# Patient Record
Sex: Female | Born: 1980 | Race: White | Hispanic: Yes | Marital: Single | Smoking: Never smoker
Health system: Southern US, Community
[De-identification: ages and names within clinical notes are randomized; demographics above are authoritative.]

## PROBLEM LIST (undated history)

## (undated) DIAGNOSIS — K529 Noninfective gastroenteritis and colitis, unspecified: Secondary | ICD-10-CM

## (undated) DIAGNOSIS — O24419 Gestational diabetes mellitus in pregnancy, unspecified control: Secondary | ICD-10-CM

## (undated) HISTORY — DX: Gestational diabetes mellitus in pregnancy, unspecified control: O24.419

## (undated) HISTORY — PX: NO PAST SURGERIES: SHX2092

---

## 2000-12-08 ENCOUNTER — Emergency Department (HOSPITAL_COMMUNITY): Admission: EM | Admit: 2000-12-08 | Discharge: 2000-12-08 | Payer: Self-pay | Admitting: Emergency Medicine

## 2008-04-20 ENCOUNTER — Inpatient Hospital Stay (HOSPITAL_COMMUNITY): Admission: EM | Admit: 2008-04-20 | Discharge: 2008-04-22 | Payer: Self-pay | Admitting: Emergency Medicine

## 2009-08-11 ENCOUNTER — Encounter: Payer: Self-pay | Admitting: Cardiology

## 2009-08-11 DIAGNOSIS — R0989 Other specified symptoms and signs involving the circulatory and respiratory systems: Secondary | ICD-10-CM | POA: Insufficient documentation

## 2009-08-12 ENCOUNTER — Ambulatory Visit: Payer: Self-pay | Admitting: Cardiology

## 2009-08-12 DIAGNOSIS — I491 Atrial premature depolarization: Secondary | ICD-10-CM

## 2009-08-18 ENCOUNTER — Ambulatory Visit (HOSPITAL_COMMUNITY): Admission: RE | Admit: 2009-08-18 | Discharge: 2009-08-18 | Payer: Self-pay | Admitting: Family Medicine

## 2010-01-08 ENCOUNTER — Inpatient Hospital Stay (HOSPITAL_COMMUNITY)
Admission: AD | Admit: 2010-01-08 | Discharge: 2010-01-10 | Payer: Self-pay | Source: Home / Self Care | Attending: Obstetrics and Gynecology | Admitting: Obstetrics and Gynecology

## 2010-01-08 LAB — CBC
HCT: 36.7 % (ref 36.0–46.0)
Hemoglobin: 13.2 g/dL (ref 12.0–15.0)
MCH: 32.6 pg (ref 26.0–34.0)
MCHC: 36 g/dL (ref 30.0–36.0)
MCV: 90.6 fL (ref 78.0–100.0)
Platelets: 199 10*3/uL (ref 150–400)
RBC: 4.05 MIL/uL (ref 3.87–5.11)
RDW: 13.5 % (ref 11.5–15.5)
WBC: 8.4 10*3/uL (ref 4.0–10.5)

## 2010-01-08 LAB — ABO/RH: ABO/RH(D): O POS

## 2010-01-08 LAB — MRSA PCR SCREENING: MRSA by PCR: NEGATIVE

## 2010-01-08 LAB — RPR: RPR Ser Ql: NONREACTIVE

## 2010-02-01 LAB — CONVERTED CEMR LAB
BUN: 8 mg/dL (ref 6–23)
CO2: 25 meq/L (ref 19–32)
Calcium: 8.8 mg/dL (ref 8.4–10.5)
Chloride: 100 meq/L (ref 96–112)
Creatinine, Ser: 0.4 mg/dL (ref 0.4–1.2)
GFR calc non Af Amer: 200.04 mL/min (ref >60)
Glucose, Bld: 64 mg/dL — ABNORMAL LOW (ref 70–99)
Magnesium: 1.9 mg/dL (ref 1.5–2.5)
Potassium: 4 meq/L (ref 3.5–5.1)
Sodium: 134 meq/L — ABNORMAL LOW (ref 135–145)
TSH: 1.56 microintl units/mL (ref 0.35–5.50)

## 2010-02-02 ENCOUNTER — Emergency Department (HOSPITAL_COMMUNITY)
Admission: EM | Admit: 2010-02-02 | Discharge: 2010-02-02 | Payer: Self-pay | Source: Home / Self Care | Admitting: Emergency Medicine

## 2010-02-03 NOTE — Assessment & Plan Note (Signed)
Summary: np6/[redacted] weeks pregnant/palps/irregular heart beat   Visit Type:  Initial Consult Primary Provider:  Health clinic  CC:  Irregular heart beat- Pt. is [redacted] weeks pregnant.  History of Present Illness: The patient comes in today referred by the women's health clinic by Dr. Shawnie Pons for the evaluation of asymptomatic irregular heartbeats.  She is currently 30 years of age and pregnant at [redacted] weeks. This is her second pregnancy. Her other child is 30 years old and she had no problems with delivery.  She denies any palpitations, presyncope or syncope, shortness of breath, orthopnea, PND, chest pain or edema. She denies a previous cardiac history. She has had no problems with this pregnancy. Her hemoglobin is stable in the clinic. Outside lab data were reviewed  Current Medications (verified): 1)  Prenatal Vitamins 0.8 Mg Tabs (Prenatal Multivit-Min-Fe-Fa) .... 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  Alcohol Use - no Drug Use - no  Risk Factors: Smoking Status: never (08/11/2009)  Review of Systems       negative other than history of present illness  Vital Signs:  Patient profile:   30 year old female Height:      59 inches Weight:      149 pounds BMI:     30.20 Pulse rate:   69 / minute Pulse rhythm:   irregular Resp:     18 per minute BP sitting:   98 / 70  (left arm) Cuff size:   large  Vitals Entered By: Danielle Rankin, CMA (August 12, 2009 12:49 PM)  Physical Exam  General:  pleasant, no acute distress, pregnant Head:  normocephalic and atraumatic Eyes:  PERRLA/EOM intact; conjunctiva and lids normal. Neck:  Neck supple, no JVD. No masses, thyromegaly or abnormal cervical nodes. Chest  Loree Shehata:  no deformities or breast masses noted Lungs:  Clear bilaterally to auscultation and percussion. Heart:  PMI nondisplaced, normal S1-S2, physiologically split, no murmur or gallop. Carotids are normal. No JVD Abdomen:  positive bowel sounds,gravida Msk:  Back normal, normal gait. Muscle strength and tone normal. Pulses:  pulses normal in all 4 extremities Extremities:  No clubbing or cyanosis. Neurologic:  Alert and oriented x 3. Skin:  Intact without lesions or rashes. Psych:  Normal affect.   EKG  Procedure date:  08/12/2009  Findings:      normal sinus rhythm with PACs P-R QRS and QTC intervals are normal.  Impression & Recommendations:  Problem # 1:  PREMATURE ATRIAL CONTRACTIONS (ICD-427.61) Assessment New These are most likely benign. She steadily symptomatically. We'll check magnesium, potassium, and TSH. I do not feel an echocardiogram is necessary with a normal EKG otherwise not to mention a normal exam. We'll see her back p.r.n. Orders: EKG w/ Interpretation (93000) TLB-TSH (Thyroid Stimulating Hormone) (84443-TSH) TLB-Magnesium (Mg) (83735-MG) TLB-BMP (Basic Metabolic Panel-BMET) (80048-METABOL)  Patient Instructions: 1)  Your physician recommends that you schedule a follow-up appointment in:  as needed with Dr. Daleen Squibb 2)  Your physician recommends that you continue on your current medications as directed. Please refer to the Current Medication list given to you today. 3)  Your physician recommends that you return for lab work today bmet, tsh, mg

## 2010-02-03 NOTE — Letter (Signed)
Summary: [facility] Dept of Public Health - Prenatal Health History   [facility] Dept of Public Health - Prenatal Health History   Imported By: Marylou Mccoy 08/25/2009 10:29:54  _____________________________________________________________________  External Attachment:    Type:   Image     Comment:   External Document

## 2010-02-11 ENCOUNTER — Encounter: Payer: Self-pay | Admitting: Obstetrics and Gynecology

## 2010-02-11 DIAGNOSIS — O9122 Nonpurulent mastitis associated with the puerperium: Secondary | ICD-10-CM

## 2010-02-16 ENCOUNTER — Ambulatory Visit: Payer: Self-pay | Admitting: Obstetrics and Gynecology

## 2010-02-16 DIAGNOSIS — N61 Mastitis without abscess: Secondary | ICD-10-CM

## 2010-02-25 ENCOUNTER — Ambulatory Visit (INDEPENDENT_AMBULATORY_CARE_PROVIDER_SITE_OTHER): Payer: Self-pay | Admitting: Obstetrics and Gynecology

## 2010-02-25 DIAGNOSIS — N644 Mastodynia: Secondary | ICD-10-CM

## 2010-03-27 NOTE — Progress Notes (Unsigned)
NAME:  Melissa [name], Melissa [name] NO.:  [id number]  MEDICAL RECORD NO.:  [id number]           PATIENT TYPE:  A  LOCATION:  [facility]                   FACILITY:  [facility]  PHYSICIAN:  [name], MD        DATE OF BIRTH:  12-26-1980  DATE OF SERVICE:  02/11/2010                                 CLINIC NOTE  The patient is a 30 year old Spanish-speaking Hispanic female, 5 weeks' postpartum, was seen 1 week ago in the MAU with an acute mastitis on the right breast just to the lateral side of the nipple and she was treated with Keflex, hot compresses, and to begin to drain.  At the present time, the ares is soft.  There is still with slight inflammation.  She is finished her medication.  I am going to put her for another week back on Keflex which she just stopped yesterday and given appointment for 1 week.  I have told her if it completely resolves by that time, she does not need to come back.  If it is still any redness there she would come back to the clinic.  IMPRESSION:  Acute mastitis, resolving.          ______________________________ [name], MD    PR/MEDQ  D:  02/11/2010  T:  02/12/2010  Job:  [id number]

## 2010-03-27 NOTE — Progress Notes (Unsigned)
NAMEMarland Kitchen  Melissa, [name] NO.:  [id number]  MEDICAL RECORD NO.:  [id number]           PATIENT TYPE:  A  LOCATION:  [facility]                   FACILITY:  [facility]  PHYSICIAN:  [name], MD        DATE OF BIRTH:  1980/09/02  DATE OF SERVICE:  02/25/2010                                 CLINIC NOTE  This is a very nice 30 year old Spanish-speaking Hispanic female who is here for followup of mastitis pain.  The patient had been seen previously 2 times in the MAU as well as one time here in the clinic, and has been given a total of 2 different treatments with Keflex for one week.  The patient states that actually the pain is much better at this point.  The baby is breastfeeding now from that side as well as which has helped out tremulously.  The patient is still doing some of the warm compresses to try to help as well as pumping milk from time to time, but states she is doing very good.  She has not had a fever for quite some time and that she even feels that the redness around the nipple area is improving.  PHYSICAL EXAMINATION:  VITAL SIGNS:  Temperature 97.6, pulse 67, blood pressure 112/77, and weight 136 pounds. GENERAL:  No apparent distress. BREASTS:  Right breast area has some mild erythema still surrounding the areola.  No firmness at this point, which is an improvement from previous visit.  No breast discharge except for milk.  ASSESSMENT AND PLAN:  This is a 30 year old female with resolved acute mastitis.  The patient is to follow up p.r.n.  The patient was given red flags to watch out for as to when to return or seek medical attention, otherwise to continue doing everything that she has.  The patient has already had her postpartum check and is doing very well.  If she needs anything else she knows how to get hold of this.    ______________________________ Nilda Simmer, MD   ______________________________ [name], MD   KS/MEDQ  D:  02/25/2010   T:  02/26/2010  Job:  [id number]

## 2010-03-27 NOTE — Progress Notes (Unsigned)
NAME:  Melissa [name], BRUYERE NO.:  [id number]  MEDICAL RECORD NO.:  [id number]           PATIENT TYPE:  A  [facility]ATION[facility]  [facility]                   FACILITY:  [facility]  PHYSICIAN:  [name], MD        DATE OF BIRTH:  22-Jul-1980  DATE OF SERVICE:  02/16/2010                                 CLINIC NOTE  The patient is a 30 year old Spanish-speaking Hispanic female, who was into the MAU 2 weeks ago with acute mastitis, was placed on Keflex, came in last week 5 days ago, was given a second prescription, and told to come back for followup.  There is still some erythema around the right breast around the areola and a firmness.  I have told the patient to continue to empty that after nursing on the other side.  The baby does not like to nurse on that side and then add moist heat.  She did not fill the prescription properly last time, so we will give her another prescription for a week, have her come back in 9 days, and reevaluate.  IMPRESSION:  Acute mastitis, resolving.          ______________________________ [name], MD    PR/MEDQ  D:  02/16/2010  T:  02/17/2010  Job:  [id number]

## 2010-04-15 LAB — BASIC METABOLIC PANEL
BUN: 4 mg/dL — ABNORMAL LOW (ref 6–23)
CO2: 25 mEq/L (ref 19–32)
Calcium: 8.4 mg/dL (ref 8.4–10.5)
Chloride: 109 mEq/L (ref 96–112)
Creatinine, Ser: 0.58 mg/dL (ref 0.4–1.2)
GFR calc Af Amer: >60 mL/min (ref >60)
GFR calc non Af Amer: >60 mL/min (ref >60)
Glucose, Bld: 102 mg/dL — ABNORMAL HIGH (ref 70–99)
Potassium: 3.5 mEq/L (ref 3.5–5.1)
Sodium: 139 mEq/L (ref 135–145)

## 2010-04-15 LAB — DIFFERENTIAL
Basophils Absolute: 0 10*3/uL (ref 0.0–0.1)
Basophils Relative: 0 % (ref 0–1)
Eosinophils Absolute: 0 10*3/uL (ref 0.0–0.7)
Eosinophils Relative: 0 % (ref 0–5)
Lymphocytes Relative: 6 % — ABNORMAL LOW (ref 12–46)
Lymphs Abs: 1 10*3/uL (ref 0.7–4.0)
Monocytes Absolute: 1.1 10*3/uL — ABNORMAL HIGH (ref 0.1–1.0)
Monocytes Relative: 6 % (ref 3–12)
Neutro Abs: 15.6 10*3/uL — ABNORMAL HIGH (ref 1.7–7.7)
Neutrophils Relative %: 88 % — ABNORMAL HIGH (ref 43–77)

## 2010-04-15 LAB — CBC
HCT: 32.9 % — ABNORMAL LOW (ref 36.0–46.0)
HCT: 33.9 % — ABNORMAL LOW (ref 36.0–46.0)
HCT: 40.4 % (ref 36.0–46.0)
Hemoglobin: 11.5 g/dL — ABNORMAL LOW (ref 12.0–15.0)
Hemoglobin: 12 g/dL (ref 12.0–15.0)
Hemoglobin: 14 g/dL (ref 12.0–15.0)
MCHC: 34.6 g/dL (ref 30.0–36.0)
MCHC: 34.9 g/dL (ref 30.0–36.0)
MCHC: 35.3 g/dL (ref 30.0–36.0)
MCV: 95 fL (ref 78.0–100.0)
MCV: 95 fL (ref 78.0–100.0)
MCV: 95.3 fL (ref 78.0–100.0)
Platelets: 173 10*3/uL (ref 150–400)
Platelets: 189 10*3/uL (ref 150–400)
Platelets: 204 10*3/uL (ref 150–400)
RBC: 3.45 MIL/uL — ABNORMAL LOW (ref 3.87–5.11)
RBC: 3.57 MIL/uL — ABNORMAL LOW (ref 3.87–5.11)
RBC: 4.26 MIL/uL (ref 3.87–5.11)
RDW: 12.2 % (ref 11.5–15.5)
RDW: 12.4 % (ref 11.5–15.5)
RDW: 12.4 % (ref 11.5–15.5)
WBC: 10.8 10*3/uL — ABNORMAL HIGH (ref 4.0–10.5)
WBC: 17.7 10*3/uL — ABNORMAL HIGH (ref 4.0–10.5)
WBC: 6.9 10*3/uL (ref 4.0–10.5)

## 2010-04-15 LAB — CULTURE, ROUTINE-ABSCESS

## 2010-04-15 LAB — POCT PREGNANCY, URINE: Preg Test, Ur: NEGATIVE

## 2010-04-15 LAB — ANAEROBIC CULTURE

## 2010-04-15 LAB — RPR: RPR Ser Ql: NONREACTIVE

## 2010-05-19 NOTE — Op Note (Signed)
[name], Melissa [name]                  ACCOUNT NO.:  [id number]   MEDICAL RECORD NO.:  [id number]          PATIENT TYPE:  INP   LOCATION:  5114                         FACILITY:  [facility]   PHYSICIAN:  Lennie Muckle, MD      DATE OF BIRTH:  08/13/1980   DATE OF PROCEDURE:  DATE OF DISCHARGE:                               OPERATIVE REPORT   PREOPERATIVE DIAGNOSES:  Bilateral groin abscesses and left buttock  abscess.   POSTOPERATIVE DIAGNOSES:  Bilateral groin abscesses and left buttock  abscess.   PROCEDURE:  Incision and drainage of bilateral groin abscesses and left  buttock abscess.   SURGEON:  Amber L. Freida Busman, MD   ASSISTANTS:  None.   ANESTHESIA:  General endotracheal anesthesia.   FINDINGS:  Purulent fluid right groin, small area left groin, and  purulent fluid buttock area, left buttock.   ESTIMATED BLOOD LOSS:  Minimal.   Cultures of left and right groin were sent to Pathology.   COMPLICATIONS:  No immediate complications.   INDICATIONS FOR PROCEDURE:  [name] is a 30 year old female who was  seen in the emergency department due to 3 days' pain of increasing pain  and swelling of the right groin.  She also had swelling of the left  buttock region and left groin.  Examination revealed a fluctuant  hardness on the right groin, left groin, and buttock area.  Informed  consent was obtained for incision and drainage of abscesses.  She  received vancomycin in the emergency department.   DETAILS OF THE PROCEDURE:  [name] was identified in the preoperative  holding area.  All questions were answered.  She was then taken to the  operating room and placed in supine position.  After administration of  general endotracheal anesthesia, bilateral groins were prepped and  draped in usual sterile fashion.  I placed an incision directly over the  right groin area of fluctuance.  Purulent fluid was expressed.  I opened  the pocket approximately 10 cm.  Purulent fluid was expressed.   There  was a swab, sent for culture.  I then performed incision and drainage on  the left groin.  Small pocket was encountered with no purulent fluid.  We irrigated both these areas.  I packed the right groin with 4x4 and  packed the left groin with iodoform gauze and then flipped her in the  lateral position and  performed an incision and drainage of the buttock area, purulent fluid  was expressed.  I opened this approximately 5 cm.  This was irrigated  and packed with gauze.  She will begin dressing changes tomorrow.  I  placed her on vancomycin and continue to monitor.      Lennie Muckle, MD  Electronically Signed     ALA/MEDQ  D:  04/20/2008  T:  04/21/2008  Job:  [id number]

## 2010-05-19 NOTE — H&P (Signed)
Melissa [name], Melissa [name]                  ACCOUNT NO.:  [id number]   MEDICAL RECORD NO.:  [id number]          PATIENT TYPE:  INP   LOCATION:  5114                         FACILITY:  [facility]   PHYSICIAN:  Lennie Muckle, MD      DATE OF BIRTH:  1980/07/17   DATE OF ADMISSION:  04/20/2008  DATE OF DISCHARGE:                              HISTORY & PHYSICAL   REASON FOR ADMISSION:  Abscess.   [name] is a 30 year old female who has had approximately 3-4 days of  swelling in her right groin and left groin as well as left buttock area.  She states that the pain worsened and the swelling became more intense.  She states that she did have a small amount of drainage from the right  hip area.  She tried a triple antibiotic ointment, but due to the pain,  came to the emergency department.  She also describes an area on her  left buttock cheek.  She does report being afebrile at home, but no  documented fevers.  Her temperature was 98.3 in the emergency  department.  White count is elevated at 17.7.   PAST MEDICAL HISTORY:  Negative.   PAST SURGICAL HISTORY:  Negative.   MEDICATIONS:  She takes no medications.   ALLERGIES:  Has no drug allergies.   SOCIAL HISTORY:  No tobacco or alcohol use.   REVIEW OF SYSTEMS:  Negative.   PHYSICAL EXAMINATION:  GENERAL:  She is pleasant young female appears  uncomfortable.  VITAL SIGNS:  Temperature 98.3, blood pressure 120/74, and pulse 114.  HEENT:  She does have some reddening of her sclerae.  Head is  normocephalic.  CHEST:  Clear to auscultation bilaterally.  CARDIOVASCULAR:  Tachycardic.  ABDOMEN:  Soft, nontender, and nondistended.  SKIN:  Reveals erythema, right groin is more extensive than the left  groin.  Small pinpoint area bilaterally with a white color.  This is  painful to the touch.  On the right, there is fluctuance noted.  On  examination of the perineum, there is a small hardened area on the left  buttock cheek with a white pinpoint  lesion.   At the bedside, the ultrasound was performed in the emergency  department, which revealed a large fluid collection and extending to the  inguinal canal.  The patient was taken to the operating room for  incision and drainage and washout of the area.   ASSESSMENT AND PLAN:  Abscess on her right groin, left groin and  possible buttock abscess.  I discussed with the patient forming incision  and drainage with washout.  She has been on vancomycin in the emergency  department.  We will do this today keep her pain control and dressing  changes, and when the wound looks clean now she would be able to be  discharged home.      Lennie Muckle, MD  Electronically Signed     ALA/MEDQ  D:  04/20/2008  T:  04/20/2008  Job:  [id number]

## 2011-01-05 NOTE — L&D Delivery Note (Signed)
I was present for the delivery and agree with above.  [place], CNM 12/23/2011 1:09 PM

## 2011-01-05 NOTE — L&D Delivery Note (Signed)
Delivery Note At 11:14 AM a viable female was delivered via Vaginal, Spontaneous Delivery (Presentation: Left Occiput Anterior).  APGAR: 9, 9; weight pending.   Placenta status: Intact, Spontaneous.  Cord: 3 vessels with the following complications: None other than short cord.  Cord pH: not drawn Of note, pt had a slight anterior lip of cervix through delivery and was manually pushed behind anterior shoulder following delivery of the baby's head, without further complication.  Anesthesia:  none Episiotomy: None Lacerations: None Suture Repair: n/a Est. Blood Loss (mL): 200  Mom to postpartum.  Baby to nursery-stable.  Dorathy Kinsman, CNM was present for and assisted with delivery in its entirety.  Street, Christopher 12/23/2011, 11:39 AM

## 2011-10-11 ENCOUNTER — Other Ambulatory Visit (HOSPITAL_COMMUNITY): Payer: Self-pay | Admitting: Family

## 2011-10-11 DIAGNOSIS — O4440 Low lying placenta NOS or without hemorrhage, unspecified trimester: Secondary | ICD-10-CM

## 2011-10-14 ENCOUNTER — Other Ambulatory Visit (HOSPITAL_COMMUNITY): Payer: Self-pay | Admitting: Family

## 2011-10-14 ENCOUNTER — Ambulatory Visit (HOSPITAL_COMMUNITY)
Admission: RE | Admit: 2011-10-14 | Discharge: 2011-10-14 | Disposition: A | Discharge status: Home / Self Care | Payer: Self-pay | Source: Ambulatory Visit | Attending: Family | Admitting: Family

## 2011-10-14 DIAGNOSIS — O4400 Placenta previa specified as without hemorrhage, unspecified trimester: Secondary | ICD-10-CM | POA: Insufficient documentation

## 2011-10-14 DIAGNOSIS — O4440 Low lying placenta NOS or without hemorrhage, unspecified trimester: Secondary | ICD-10-CM

## 2011-12-23 ENCOUNTER — Inpatient Hospital Stay (HOSPITAL_COMMUNITY)
Admission: AD | Admit: 2011-12-23 | Discharge: 2011-12-24 | DRG: 775 | Disposition: A | Discharge status: Home / Self Care | Payer: Medicaid Other | Source: Ambulatory Visit | Attending: Obstetrics & Gynecology | Admitting: Obstetrics & Gynecology

## 2011-12-23 ENCOUNTER — Encounter (HOSPITAL_COMMUNITY): Payer: Self-pay | Admitting: *Deleted

## 2011-12-23 LAB — TYPE AND SCREEN
ABO/RH(D): O POS
Antibody Screen: NEGATIVE

## 2011-12-23 LAB — OB RESULTS CONSOLE HEPATITIS B SURFACE ANTIGEN: Hepatitis B Surface Ag: NEGATIVE

## 2011-12-23 LAB — OB RESULTS CONSOLE ANTIBODY SCREEN: Antibody Screen: NEGATIVE

## 2011-12-23 LAB — CBC
HCT: 39.1 % (ref 36.0–46.0)
Hemoglobin: 13.5 g/dL (ref 12.0–15.0)
MCH: 31.3 pg (ref 26.0–34.0)
MCHC: 34.5 g/dL (ref 30.0–36.0)
MCV: 90.5 fL (ref 78.0–100.0)
Platelets: 195 10*3/uL (ref 150–400)
RDW: 13.3 % (ref 11.5–15.5)
WBC: 11.2 10*3/uL — ABNORMAL HIGH (ref 4.0–10.5)

## 2011-12-23 LAB — RPR: RPR Ser Ql: NONREACTIVE

## 2011-12-23 LAB — OB RESULTS CONSOLE RUBELLA ANTIBODY, IGM: Rubella: IMMUNE

## 2011-12-23 LAB — MRSA PCR SCREENING: MRSA by PCR: NEGATIVE

## 2011-12-23 MED ORDER — LACTATED RINGERS IV SOLN: 500.0000 mL | INTRAVENOUS | Status: DC | PRN

## 2011-12-23 MED ORDER — BENZOCAINE-MENTHOL 20-0.5 % EX AERO: 1.0000 "application " | INHALATION_SPRAY | CUTANEOUS | Status: DC | PRN

## 2011-12-23 MED ORDER — PRENATAL MULTIVITAMIN CH
1.0000 | ORAL_TABLET | Freq: Every day | ORAL | Status: DC
  Administered 2011-12-23 – 2011-12-24 (×2): 1 via ORAL
  Filled 2011-12-23 (×2): qty 1

## 2011-12-23 MED ORDER — OXYCODONE-ACETAMINOPHEN 5-325 MG PO TABS
1.0000 | ORAL_TABLET | ORAL | Status: DC | PRN
Start: 2011-12-23 — End: 2011-12-23

## 2011-12-23 MED ORDER — IBUPROFEN 600 MG PO TABS: 600.0000 mg | ORAL_TABLET | Freq: Four times a day (QID) | ORAL | Status: DC | PRN

## 2011-12-23 MED ORDER — OXYTOCIN 10 UNIT/ML IJ SOLN
INTRAMUSCULAR | Status: AC
  Filled 2011-12-23: qty 2

## 2011-12-23 MED ORDER — OXYTOCIN 40 UNITS IN LACTATED RINGERS INFUSION - SIMPLE MED
62.5000 mL/h | INTRAVENOUS | Status: DC
  Filled 2011-12-23: qty 1000

## 2011-12-23 MED ORDER — ZOLPIDEM TARTRATE 5 MG PO TABS: 5.0000 mg | ORAL_TABLET | Freq: Every evening | ORAL | Status: DC | PRN

## 2011-12-23 MED ORDER — MISOPROSTOL 200 MCG PO TABS
ORAL_TABLET | ORAL | Status: AC
  Filled 2011-12-23: qty 4

## 2011-12-23 MED ORDER — LIDOCAINE HCL (PF) 1 % IJ SOLN
30.0000 mL | INTRAMUSCULAR | Status: DC | PRN
  Filled 2011-12-23: qty 30

## 2011-12-23 MED ORDER — LACTATED RINGERS IV SOLN
INTRAVENOUS | Status: DC
  Administered 2011-12-23: 10:00:00 via INTRAVENOUS

## 2011-12-23 MED ORDER — CITRIC ACID-SODIUM CITRATE 334-500 MG/5ML PO SOLN: 30.0000 mL | ORAL | Status: DC | PRN

## 2011-12-23 MED ORDER — IBUPROFEN 600 MG PO TABS
600.0000 mg | ORAL_TABLET | Freq: Four times a day (QID) | ORAL | Status: DC
  Administered 2011-12-23 – 2011-12-24 (×4): 600 mg via ORAL
  Filled 2011-12-23 (×4): qty 1

## 2011-12-23 MED ORDER — SENNOSIDES-DOCUSATE SODIUM 8.6-50 MG PO TABS
2.0000 | ORAL_TABLET | Freq: Every day | ORAL | Status: DC
  Administered 2011-12-23: 2 via ORAL

## 2011-12-23 MED ORDER — SIMETHICONE 80 MG PO CHEW: 80.0000 mg | CHEWABLE_TABLET | ORAL | Status: DC | PRN

## 2011-12-23 MED ORDER — OXYTOCIN BOLUS FROM INFUSION
500.0000 mL | INTRAVENOUS | Status: DC
  Administered 2011-12-23: 500 mL via INTRAVENOUS

## 2011-12-23 MED ORDER — ONDANSETRON HCL 4 MG/2ML IJ SOLN: 4.0000 mg | INTRAMUSCULAR | Status: DC | PRN

## 2011-12-23 MED ORDER — OXYCODONE-ACETAMINOPHEN 5-325 MG PO TABS: 1.0000 | ORAL_TABLET | ORAL | Status: DC | PRN

## 2011-12-23 MED ORDER — WITCH HAZEL-GLYCERIN EX PADS: 1.0000 "application " | MEDICATED_PAD | CUTANEOUS | Status: DC | PRN

## 2011-12-23 MED ORDER — ONDANSETRON HCL 4 MG PO TABS: 4.0000 mg | ORAL_TABLET | ORAL | Status: DC | PRN

## 2011-12-23 MED ORDER — ONDANSETRON HCL 4 MG/2ML IJ SOLN: 4.0000 mg | Freq: Four times a day (QID) | INTRAMUSCULAR | Status: DC | PRN

## 2011-12-23 MED ORDER — ACETAMINOPHEN 325 MG PO TABS: 650.0000 mg | ORAL_TABLET | ORAL | Status: DC | PRN

## 2011-12-23 MED ORDER — DIBUCAINE 1 % RE OINT: 1.0000 "application " | TOPICAL_OINTMENT | RECTAL | Status: DC | PRN

## 2011-12-23 MED ORDER — LANOLIN HYDROUS EX OINT: TOPICAL_OINTMENT | CUTANEOUS | Status: DC | PRN

## 2011-12-23 MED ORDER — DIPHENHYDRAMINE HCL 25 MG PO CAPS: 25.0000 mg | ORAL_CAPSULE | Freq: Four times a day (QID) | ORAL | Status: DC | PRN

## 2011-12-23 MED ORDER — TETANUS-DIPHTH-ACELL PERTUSSIS 5-2.5-18.5 LF-MCG/0.5 IM SUSP: 0.5000 mL | Freq: Once | INTRAMUSCULAR | Status: DC

## 2011-12-23 NOTE — MAU Note (Signed)
Dr. Casper Harrison notified pt's cervix is 6cm/100%, G3P2, orders to admit to birthing suites.

## 2011-12-23 NOTE — MAU Note (Signed)
3rd baby, no problems.  Care at health dept.  No bleeding no leaking.

## 2011-12-23 NOTE — H&P (Signed)
HPI: Melissa [name] is a 31 y.o. year old G89P2002 female at [redacted]w[redacted]d weeks gestation by 91 week [place] who presents to MAU reporting Labor. PNC at [facility]. Denies HA, vision changes, epigastric pain LOF or VB. Good FM.   History OB History    Grav Para Term Preterm Abortions TAB SAB Ect Mult Living   3 2 2  0 0 0 0 0 0 2     Past Medical History  Diagnosis Date  . No pertinent past medical history    Past Surgical History  Procedure Date  . No past surgeries    Family History: family history is negative for Other. Social History:  reports that she has never smoked. She has never used smokeless tobacco. She reports that she does not drink alcohol or use illicit drugs.  Review of Systems  Unable to perform ROS ROS: Pertinent findings noted in HPI.   Dilation: 7.5 Effacement (%): 100 Station: -1 Exam by:: Streeter MD Blood pressure 144/83, pulse 93, temperature 97.5 F (36.4 C), temperature source Oral, resp. rate 20. Maternal Exam:  Uterine Assessment: Contraction strength is firm.  Contraction frequency is regular.   Abdomen: Fundal height is S=D.    Introitus: Normal vulva. Pelvis: adequate for delivery.   Cervix: Cervix evaluated by digital exam.     Fetal Exam Fetal Monitor Review: Mode: ultrasound.   Baseline rate: 130.  Variability: moderate (6-25 bpm).   Pattern: accelerations present and no decelerations.    Fetal State Assessment: Category I - tracings are normal.     Physical Exam  Constitutional: She is oriented to person, place, and time. She appears well-developed and well-nourished. No distress.  HENT:  Head: Normocephalic.  Cardiovascular: Normal rate and regular rhythm.   Respiratory: Effort normal and breath sounds normal.  GI: Soft. There is no tenderness.  Genitourinary: Vagina normal.  Musculoskeletal: Normal range of motion.  Neurological: She is alert and oriented to person, place, and time. She has normal reflexes.  Skin: Skin is warm and  dry.  Psychiatric: She has a normal mood and affect.    Prenatal labs: ABO, Rh:  O pos Antibody:   Rubella:  Immune RPR:   NR HBsAg:   NR HIV:   neg GBS:   Neg Quad normal 1 Hour GTT 155, 3 hour GTT normal  Assessment: 1. Labor: transition 2. Fetal Wellbeing: Category I  3. Pain Control: none 4. GBS: neg 5. 38.5 week IUP  Plan:  1. Admit to BS per consult with MD 2. Routine L&D orders 3. Analgesia/anesthesia PRN   Dorathy Kinsman 12/23/2011, 8:58 AM

## 2011-12-24 MED ORDER — IBUPROFEN 600 MG PO TABS: 600.0000 mg | ORAL_TABLET | Freq: Four times a day (QID) | ORAL | Status: DC

## 2011-12-24 MED ORDER — NORETHINDRONE 0.35 MG PO TABS: 1.0000 | ORAL_TABLET | Freq: Every day | ORAL | Status: DC

## 2011-12-24 NOTE — Discharge Summary (Signed)
Obstetric Discharge Summary Reason for Admission: onset of labor Prenatal Procedures: none Intrapartum Procedures: spontaneous vaginal delivery Postpartum Procedures: none Complications-Operative and Postpartum: none Hemoglobin  Date Value Range Status  12/23/2011 13.5  12.0 - 15.0 g/dL Final     HCT  Date Value Range Status  12/23/2011 39.1  36.0 - 46.0 % Final    Physical Exam:  General: alert, cooperative and no distress Lochia: appropriate Uterine Fundus: firm DVT Evaluation: No evidence of DVT seen on physical exam.  Discharge Diagnoses: Term Pregnancy-delivered  Discharge Information: Date: 12/24/2011 Activity: pelvic rest Diet: routine Medications: PNV, Ibuprofen and MIcronor to start 01/09/12 Condition: stable Instructions: refer to practice specific booklet Discharge to: home Follow-up Information    Follow up with [facility] [place]. (Make an appointment for 4-6 weeks postpartum)    Contact information:   [address] [place] [address] [phone]         Newborn Data: Live born female  Birth Weight: 7 lb 11.6 oz (3504 g) APGAR: 9, 9  Home with mother. Breastfeeding going well; pt desires OCPs for contraception.  Cam Hai 12/24/2011, 10:43 AM

## 2013-01-04 DIAGNOSIS — O24419 Gestational diabetes mellitus in pregnancy, unspecified control: Secondary | ICD-10-CM

## 2013-01-04 DIAGNOSIS — R7303 Prediabetes: Secondary | ICD-10-CM | POA: Insufficient documentation

## 2013-01-04 DIAGNOSIS — Z0000 Encounter for general adult medical examination without abnormal findings: Secondary | ICD-10-CM | POA: Insufficient documentation

## 2013-01-04 HISTORY — DX: Gestational diabetes mellitus in pregnancy, unspecified control: O24.419

## 2013-01-04 NOTE — L&D Delivery Note (Signed)
Delivery Note At 8:20 PM a viable and healthy female was delivered via Vaginal, Spontaneous Delivery (Presentation: Left Occiput Anterior).  APGAR:9,9; weight- pending.  Placenta status: intact.  Cord: 3 vessel with the following complications: none .    Anesthesia: None  Episiotomy: None Lacerations: None Suture Repair: n/a Est. Blood Loss (mL): 150  Mom doing well, bonding with baby. FOB at bedside. Mom to postpartum.  Baby to Couplet care / Skin to Skin.  ADAMS,SHNIQUAL SHWON 11/23/2013, 8:33 PM  I was present in the room as the infant was delivering into the hands of the RN. Ms Andree Elk arrived at the time of cord clamp and she proceeded with collection of cord blood for lab, placenta delivery, and vag inspection. I agree with the above. Serita Grammes CNM 11:49 PM 11/23/2013

## 2013-05-21 LAB — OB RESULTS CONSOLE HGB/HCT, BLOOD
HCT: 42 %
HCT: 42 %
HEMOGLOBIN: 14 g/dL
Hemoglobin: 14 g/dL
Hemoglobin: 14 g/dL

## 2013-05-21 LAB — OB RESULTS CONSOLE GC/CHLAMYDIA
Chlamydia: NEGATIVE
Chlamydia: NEGATIVE
GC PROBE AMP, GENITAL: NEGATIVE
Gonorrhea: NEGATIVE

## 2013-05-21 LAB — OB RESULTS CONSOLE ABO/RH: RH Type: POSITIVE

## 2013-05-21 LAB — OB RESULTS CONSOLE PLATELET COUNT
PLATELETS: 228 10*3/uL
Platelets: 228 10*3/uL

## 2013-05-21 LAB — OB RESULTS CONSOLE HIV ANTIBODY (ROUTINE TESTING)
HIV: NONREACTIVE
HIV: NONREACTIVE

## 2013-05-21 LAB — OB RESULTS CONSOLE RPR: RPR: NONREACTIVE

## 2013-05-21 LAB — CULTURE, OB URINE: URINE CULTURE, OB: NEGATIVE

## 2013-05-21 LAB — OB RESULTS CONSOLE RUBELLA ANTIBODY, IGM: RUBELLA: IMMUNE

## 2013-05-21 LAB — OB RESULTS CONSOLE HEPATITIS B SURFACE ANTIGEN: HEP B S AG: NEGATIVE

## 2013-05-21 LAB — OB RESULTS CONSOLE ANTIBODY SCREEN: Antibody Screen: NEGATIVE

## 2013-05-21 LAB — OB RESULTS CONSOLE VARICELLA ZOSTER ANTIBODY, IGG: Varicella: IMMUNE

## 2013-05-21 LAB — GLUCOSE TOLERANCE, 1 HOUR: Glucose, 1 Hour GTT: 154

## 2013-05-23 ENCOUNTER — Other Ambulatory Visit (HOSPITAL_COMMUNITY): Payer: Self-pay | Admitting: Nurse Practitioner

## 2013-05-23 DIAGNOSIS — Z3689 Encounter for other specified antenatal screening: Secondary | ICD-10-CM

## 2013-05-23 LAB — GLUCOSE TOLERANCE, 3 HOURS
GLUCOSE: 208
Glucose, Fasting: 74 mg/dL (ref 60–109)
Glucose: 109
Glucose: 173

## 2013-06-11 ENCOUNTER — Encounter: Payer: Self-pay | Admitting: Obstetrics & Gynecology

## 2013-06-11 ENCOUNTER — Encounter: Payer: Medicaid Other | Attending: Obstetrics & Gynecology | Admitting: *Deleted

## 2013-06-11 ENCOUNTER — Ambulatory Visit (INDEPENDENT_AMBULATORY_CARE_PROVIDER_SITE_OTHER): Payer: Medicaid Other | Admitting: Obstetrics & Gynecology

## 2013-06-11 VITALS — BP 111/73 | HR 68 | Temp 97.0°F | Ht 59.0 in | Wt 139.2 lb

## 2013-06-11 DIAGNOSIS — O24419 Gestational diabetes mellitus in pregnancy, unspecified control: Secondary | ICD-10-CM

## 2013-06-11 DIAGNOSIS — O99810 Abnormal glucose complicating pregnancy: Secondary | ICD-10-CM | POA: Diagnosis present

## 2013-06-11 DIAGNOSIS — Z713 Dietary counseling and surveillance: Secondary | ICD-10-CM | POA: Insufficient documentation

## 2013-06-11 DIAGNOSIS — O0992 Supervision of high risk pregnancy, unspecified, second trimester: Secondary | ICD-10-CM | POA: Insufficient documentation

## 2013-06-11 LAB — POCT URINALYSIS DIP (DEVICE)
BILIRUBIN URINE: NEGATIVE
GLUCOSE, UA: NEGATIVE mg/dL
Hgb urine dipstick: NEGATIVE
KETONES UR: NEGATIVE mg/dL
LEUKOCYTES UA: NEGATIVE
Nitrite: NEGATIVE
Protein, ur: NEGATIVE mg/dL
SPECIFIC GRAVITY, URINE: 1.025 (ref 1.005–1.030)
Urobilinogen, UA: 0.2 mg/dL (ref 0.0–1.0)
pH: 6 (ref 5.0–8.0)

## 2013-06-11 NOTE — Progress Notes (Signed)
Pt diagnosed with GDM around 13-14 weeks.  Will consider A/B.  Needs diabetic education today.  Missed window for First Trimester Screen.  Will order Quad screen today. No cardiac symptoms (history of PACs). ANatomy US scheudled for 07/09/13 at 8:15.

## 2013-06-11 NOTE — Progress Notes (Addendum)
Nutrition note: 1st visit consult/ GDM diet education Pt is newly diagnosed GDM pt. Pt has lost 4.8# @ 103w3d, but pt stated she has started to regain the wt over the past few weeks. Pt reports only eating 2x/d (11am & 4pm) due to poor appetite. Pt has been taking her PNV but ran out. Pt walks 20 mins/d. Pt reports no N/V or heartburn. NKFA. Pt received verbal & written education in Spanish via interpreter, Cristie Hem about GDM diet. Discussed snack options & encouraged pt to add a small breakfast before she goes to work @ 7am. Discussed wt gain goals of 15-25# or 0.6#/wk. Pt agrees to follow GDM diet with 3 meals & 1-3 snacks/d with proper CHO/ protein combination. Pt has Staves & plans to BF. F/u in 4-6 wks Vladimir Faster, MS, RD, LDN, [facility]

## 2013-06-11 NOTE — Progress Notes (Signed)
DIABETES:  Patient was seen for Gestational Diabetes self-management . The following learning objectives were met by the patient :   States the definition of Gestational Diabetes  States why dietary management is important in controlling blood glucose  States when to check blood glucose levels  Demonstrates proper blood glucose monitoring techniques  States the effect of stress and exercise on blood glucose levels  Plan:  Consider  increasing your activity level by walking daily as tolerated Begin checking BG before breakfast and 2 hours after first bit of breakfast, lunch and dinner after  as directed by MD  Take medication  as directed by MD  Blood glucose monitor given: True Result Lot # [id number]  Exp: 2015/05/18 Blood glucose reading: 34m/dl  Patient instructed to monitor glucose levels: FBS: 60 - <90 2 hour: <120  Patient received the following handouts:  Nutrition Diabetes and Pregnancy  Carbohydrate Counting List  Meal Planning worksheet  Patient will be seen for follow-up as needed.

## 2013-06-12 ENCOUNTER — Encounter: Payer: Self-pay | Admitting: Obstetrics & Gynecology

## 2013-06-12 LAB — AFP, QUAD SCREEN
AFP: 18.6 [IU]/mL
Curr Gest Age: 16.3 wks.days
HCG, Total: 25242 m[IU]/mL
INH: 219.9 pg/mL
INTERPRETATION-AFP: NEGATIVE
MOM FOR HCG: 1.06
MOM FOR INH: 1.11
MoM for AFP: 0.56
OPEN SPINA BIFIDA: NEGATIVE
TRI 18 SCR RISK EST: NEGATIVE
Trisomy 18 (Edward) Syndrome Interp.: 1:14800 {titer}
UE3 MOM: 1.06
uE3 Value: 0.6 ng/mL

## 2013-06-20 ENCOUNTER — Encounter: Payer: Self-pay | Admitting: *Deleted

## 2013-07-09 ENCOUNTER — Ambulatory Visit (INDEPENDENT_AMBULATORY_CARE_PROVIDER_SITE_OTHER): Payer: Medicaid Other | Admitting: Obstetrics & Gynecology

## 2013-07-09 ENCOUNTER — Other Ambulatory Visit (HOSPITAL_COMMUNITY): Payer: Self-pay | Admitting: Nurse Practitioner

## 2013-07-09 ENCOUNTER — Ambulatory Visit (HOSPITAL_COMMUNITY)
Admission: RE | Admit: 2013-07-09 | Discharge: 2013-07-09 | Disposition: A | Discharge status: Home / Self Care | Payer: Medicaid Other | Source: Ambulatory Visit | Attending: Nurse Practitioner | Admitting: Nurse Practitioner

## 2013-07-09 VITALS — BP 117/77 | HR 59 | Temp 97.3°F | Wt 140.4 lb

## 2013-07-09 DIAGNOSIS — I2510 Atherosclerotic heart disease of native coronary artery without angina pectoris: Secondary | ICD-10-CM

## 2013-07-09 DIAGNOSIS — O99810 Abnormal glucose complicating pregnancy: Secondary | ICD-10-CM | POA: Insufficient documentation

## 2013-07-09 DIAGNOSIS — O0992 Supervision of high risk pregnancy, unspecified, second trimester: Secondary | ICD-10-CM

## 2013-07-09 DIAGNOSIS — Z3689 Encounter for other specified antenatal screening: Secondary | ICD-10-CM | POA: Insufficient documentation

## 2013-07-09 DIAGNOSIS — O99419 Diseases of the circulatory system complicating pregnancy, unspecified trimester: Secondary | ICD-10-CM

## 2013-07-09 LAB — POCT URINALYSIS DIP (DEVICE)
BILIRUBIN URINE: NEGATIVE
GLUCOSE, UA: NEGATIVE mg/dL
HGB URINE DIPSTICK: NEGATIVE
Ketones, ur: NEGATIVE mg/dL
Nitrite: NEGATIVE
PH: 6 (ref 5.0–8.0)
Protein, ur: NEGATIVE mg/dL
Specific Gravity, Urine: 1.02 (ref 1.005–1.030)
Urobilinogen, UA: 0.2 mg/dL (ref 0.0–1.0)

## 2013-07-09 NOTE — Progress Notes (Signed)
FBS almost all < 90, PP < 120, continue diet control. Nl anatomy US today.

## 2013-07-09 NOTE — Patient Instructions (Signed)
Segundo trimestre de embarazo (Second Trimester of Pregnancy) El segundo trimestre va desde la semana13 hasta la 28, desde el cuarto hasta el sexto mes, y suele ser el momento en el que mejor se siente. Su organismo se ha adaptado a estar embarazada y comienza a sentirse fsicamente mejor. En general, las nuseas matutinas han disminuido o han desaparecido completamente, p El segundo trimestre es tambin la poca en la que el feto se desarrolla rpidamente. Hacia el final del sexto mes, el feto mide aproximadamente 9pulgadas (23cm) y pesa alrededor de 1 libras (700g). Es probable que sienta que el beb se mueve (da pataditas) entre las 18 y 20semanas del embarazo. CAMBIOS EN EL ORGANISMO Su organismo atraviesa por muchos cambios durante el embarazo, y estos varan de una mujer a otra.   Seguir aumentando de peso. Notar que la parte baja del abdomen sobresale.  Podrn aparecer las primeras estras en las caderas, el abdomen y las mamas.  Es posible que tenga dolores de cabeza que pueden aliviarse con los medicamentos que su mdico autorice.  Tal vez tenga necesidad de orinar con ms frecuencia porque el feto est ejerciendo presin sobre la vejiga.  Debido al embarazo podr sentir acidez estomacal con frecuencia.  Puede estar estreida, ya que ciertas hormonas enlentecen los movimientos de los msculos que empujan los desechos a travs de los intestinos.  Pueden aparecer hemorroides o abultarse e hincharse las venas (venas varicosas).  Puede tener dolor de espalda que se debe al aumento de peso y a que las hormonas del embarazo relajan las articulaciones entre los huesos de la pelvis, y como consecuencia de la modificacin del peso y los msculos que mantienen el equilibrio.  Las mamas seguirn creciendo y le dolern.  Las encas pueden sangrar y estar sensibles al cepillado y al hilo dental.  Pueden aparecer zonas oscuras o manchas (cloasma, mscara del embarazo) en el rostro que  probablemente se atenuarn despus del nacimiento del beb.  Es posible que se forme una lnea oscura desde el ombligo hasta la zona del pubis (linea nigra) que probablemente se atenuarn despus del nacimiento del beb.  Tal vez haya cambios en el cabello que pueden incluir su engrosamiento, crecimiento rpido y cambios en la textura. Adems, a algunas mujeres se les cae el cabello durante o despus del embarazo, o tienen el cabello seco o fino. Lo ms probable es que el cabello se le normalice despus del nacimiento del beb. QU DEBE ESPERAR EN LAS CONSULTAS PRENATALES Durante una visita prenatal de rutina:  La pesarn para asegurarse de que usted y el feto estn creciendo normalmente.  Le tomarn la presin arterial.  Le medirn el abdomen para controlar el desarrollo del beb.  Se escucharn los latidos cardacos fetales.  Se evaluarn los resultados de los estudios solicitados en visitas anteriores. El mdico puede preguntarle lo siguiente:  Cmo se siente.  Si siente los movimientos del beb.  Si ha tenido sntomas anormales, como prdida de lquido, sangrado, dolores de cabeza intensos o clicos abdominales.  Si tiene alguna pregunta. Otros estudios que podrn realizarse durante el segundo trimestre incluyen lo siguiente:  Anlisis de sangre para detectar:  Concentraciones de hierro bajas (anemia).  Diabetes gestacional (entre la semana 24 y la 28).  Anticuerpos Rh.  Anlisis de orina para detectar infecciones, diabetes o protenas en la orina.  Una ecografa para confirmar que el beb crece y se desarrolla correctamente.  Una amniocentesis para diagnosticar posibles problemas genticos.  Estudios del feto para descartar espina   bfida y sndrome de Down. INSTRUCCIONES PARA EL CUIDADO EN EL HOGAR   Evite fumar, consumir hierbas, beber alcohol y tomar frmacos que no le hayan recetado. Estas sustancias qumicas afectan la formacin y el desarrollo del beb.  Siga  las indicaciones del mdico en relacin con el uso de medicamentos. Durante el embarazo, hay medicamentos que son seguros de tomar y otros que no.  Haga actividad fsica solo en la forma indicada por el mdico. Sentir clicos uterinos es un buen signo para detener la actividad fsica.  Contine comiendo alimentos que sanos con regularidad.  Use un sostn que le brinde buen soporte si le duelen las mamas.  No se d baos de inmersin en agua caliente, baos turcos ni saunas.  Colquese el cinturn de seguridad cuando conduzca.  No coma carne cruda ni queso sin cocinar; evite el contacto con las bandejas sanitarias de los gatos y la tierra que estos animales usan. Estos elementos contienen grmenes que pueden causar defectos congnitos en el beb.  Tome las vitaminas prenatales.  Si est estreida, pruebe un laxante suave (si el mdico lo autoriza). Consuma ms alimentos ricos en fibra, como vegetales y frutas frescos y cereales integrales. Beba gran cantidad de lquido para mantener la orina de tono claro o color amarillo plido.  Dese baos de asiento con agua tibia para aliviar el dolor o las molestias causadas por las hemorroides. Use una crema para las hemorroides si el mdico la autoriza.  Si tiene venas varicosas, use medias de descanso. Eleve los pies durante 15minutos, 3 o 4veces por da. Limite la cantidad de sal en su dieta.  No levante objetos pesados, use zapatos de tacones bajos y mantenga una buena postura.  Descanse con las piernas elevadas si tiene calambres o dolor de cintura.  Visite a su dentista si an no lo ha hecho durante el embarazo. Use un cepillo de dientes blando para higienizarse los dientes y psese el hilo dental con suavidad.  Puede seguir manteniendo relaciones sexuales, a menos que el mdico le indique lo contrario.  Concurra a todas las visitas prenatales segn las indicaciones de su mdico. SOLICITE ATENCIN MDICA SI:   Tiene mareos.  Siente  clicos leves, presin en la pelvis o dolor persistente en el abdomen.  Tiene nuseas, vmitos o diarrea persistentes.  Tiene secrecin vaginal con mal olor.  Siente dolor al orinar. SOLICITE ATENCIN MDICA DE INMEDIATO SI:   Tiene fiebre.  Tiene una prdida de lquido por la vagina.  Tiene sangrado o pequeas prdidas vaginales.  Siente dolor intenso o clicos en el abdomen.  Sube o baja de peso rpidamente.  Tiene dificultad para respirar y siente dolor de pecho.  Sbitamente se le hinchan mucho el rostro, las manos, los tobillos, los pies o las piernas.  No ha sentido los movimientos del beb durante una hora.  Siente un dolor de cabeza intenso que no se alivia con medicamentos.  Hay cambios en la visin. Document Released: 09/30/2004 Document Revised: 12/26/2012 [employer] Patient Information 2015 [employer]. This information is not intended to replace advice given to you by your health care provider. Make sure you discuss any questions you have with your health care provider.  

## 2013-07-23 ENCOUNTER — Other Ambulatory Visit: Payer: Self-pay | Admitting: General Practice

## 2013-07-23 ENCOUNTER — Encounter: Payer: Self-pay | Admitting: Obstetrics & Gynecology

## 2013-07-23 ENCOUNTER — Ambulatory Visit (INDEPENDENT_AMBULATORY_CARE_PROVIDER_SITE_OTHER): Payer: Medicaid Other | Admitting: Obstetrics & Gynecology

## 2013-07-23 ENCOUNTER — Telehealth: Payer: Self-pay | Admitting: General Practice

## 2013-07-23 VITALS — BP 110/64 | HR 46 | Temp 98.1°F | Wt 143.3 lb

## 2013-07-23 DIAGNOSIS — O0992 Supervision of high risk pregnancy, unspecified, second trimester: Secondary | ICD-10-CM

## 2013-07-23 DIAGNOSIS — I2510 Atherosclerotic heart disease of native coronary artery without angina pectoris: Secondary | ICD-10-CM

## 2013-07-23 DIAGNOSIS — O24319 Unspecified pre-existing diabetes mellitus in pregnancy, unspecified trimester: Secondary | ICD-10-CM | POA: Insufficient documentation

## 2013-07-23 DIAGNOSIS — O99419 Diseases of the circulatory system complicating pregnancy, unspecified trimester: Secondary | ICD-10-CM

## 2013-07-23 DIAGNOSIS — O99810 Abnormal glucose complicating pregnancy: Secondary | ICD-10-CM

## 2013-07-23 LAB — POCT URINALYSIS DIP (DEVICE)
BILIRUBIN URINE: NEGATIVE
Glucose, UA: NEGATIVE mg/dL
Hgb urine dipstick: NEGATIVE
KETONES UR: NEGATIVE mg/dL
LEUKOCYTES UA: NEGATIVE
Nitrite: NEGATIVE
Protein, ur: NEGATIVE mg/dL
Specific Gravity, Urine: 1.015 (ref 1.005–1.030)
Urobilinogen, UA: 0.2 mg/dL (ref 0.0–1.0)
pH: 6 (ref 5.0–8.0)

## 2013-07-23 MED ORDER — GLUCOSE BLOOD VI STRP: ORAL_STRIP | Status: DC

## 2013-07-23 MED ORDER — ACCU-CHEK FASTCLIX LANCETS MISC: 1.0000 | Freq: Four times a day (QID) | Status: DC

## 2013-07-23 NOTE — Progress Notes (Signed)
/  Fasting: 78/76/84/78/68/81/82/82/77/83/80/77/76/84/81/75/90/89/90/86/86/81/90/101/77/81/77/85/86/90/8//85/88/82/96/78/75/8587/90/82// PpBreakfast: 83/74/74/60/64/69/97/105/79/92/62/71/97/80/94/74/101/86/87/114/100/99/87/74/92/83/84/9/9 PpLunch: 102/92/117/106/93/73/109/88/99/118/99/101/102/113/100/119/99/130/105/110/103/102/116/98/99/130/113/101 PpDinner:108/117/105/120/98/117/106/119/119/120/87/100/91/115/101/98/86/116/90/102/98/108/115/130/98/105100/102/115/127/151/92/82/99/107/110/118/124/108/104104/107 Nml anatomy with needing f/u of cardiac anatomy.  Can discuss fetal echo with patient next visit. Will get baseline 24 hour urine due to A/B.

## 2013-07-23 NOTE — Addendum Note (Signed)
Addended by: Novella Olive on: 07/23/2013 12:31 PM   Modules accepted: Orders

## 2013-07-23 NOTE — Telephone Encounter (Signed)
Patient no covered by insurance and has been having difficulty getting lancets and strips. Called patient with pac[place] interpreter [phone]. No answer- left message for her to come by the clinic tomorrow for her free medication as she is not covered by insurance, she may call us back if she has any questions. Patient needs free strips and lancets

## 2013-07-24 ENCOUNTER — Other Ambulatory Visit: Payer: Self-pay | Admitting: *Deleted

## 2013-07-24 DIAGNOSIS — O0992 Supervision of high risk pregnancy, unspecified, second trimester: Secondary | ICD-10-CM

## 2013-07-24 LAB — COMPREHENSIVE METABOLIC PANEL
ALBUMIN: 3.3 g/dL — AB (ref 3.5–5.2)
ALT: 12 U/L (ref 0–35)
AST: 14 U/L (ref 0–37)
Alkaline Phosphatase: 53 U/L (ref 39–117)
BILIRUBIN TOTAL: 0.3 mg/dL (ref 0.2–1.2)
BUN: 8 mg/dL (ref 6–23)
CALCIUM: 8.1 mg/dL — AB (ref 8.4–10.5)
CHLORIDE: 105 meq/L (ref 96–112)
CO2: 20 meq/L (ref 19–32)
Creat: 0.44 mg/dL — ABNORMAL LOW (ref 0.50–1.10)
GLUCOSE: 67 mg/dL — AB (ref 70–99)
Potassium: 4 mEq/L (ref 3.5–5.3)
SODIUM: 136 meq/L (ref 135–145)
TOTAL PROTEIN: 6.1 g/dL (ref 6.0–8.3)

## 2013-07-24 NOTE — Addendum Note (Signed)
Addended by: Novella Olive on: 07/24/2013 10:27 AM   Modules accepted: Orders

## 2013-07-26 ENCOUNTER — Encounter: Payer: Self-pay | Admitting: Obstetrics & Gynecology

## 2013-07-27 LAB — PROTEIN, URINE, 24 HOUR
PROTEIN 24H UR: 75 mg/d (ref 50–100)
PROTEIN, URINE: 5 mg/dL

## 2013-07-27 LAB — CREATININE CLEARANCE, URINE, 24 HOUR
CREATININE 24H UR: 1410 mg/d (ref 700–1800)
Creatinine Clearance: 223 mL/min — ABNORMAL HIGH (ref 75–115)
Creatinine, Urine: 94 mg/dL
Creatinine: 0.44 mg/dL — ABNORMAL LOW (ref 0.50–1.10)

## 2013-08-06 ENCOUNTER — Ambulatory Visit (HOSPITAL_COMMUNITY)
Admission: RE | Admit: 2013-08-06 | Discharge: 2013-08-06 | Disposition: A | Discharge status: Home / Self Care | Payer: Medicaid Other | Source: Ambulatory Visit | Attending: Obstetrics & Gynecology | Admitting: Obstetrics & Gynecology

## 2013-08-06 DIAGNOSIS — O0992 Supervision of high risk pregnancy, unspecified, second trimester: Secondary | ICD-10-CM

## 2013-08-06 DIAGNOSIS — Z36 Encounter for antenatal screening of mother: Secondary | ICD-10-CM | POA: Insufficient documentation

## 2013-08-13 ENCOUNTER — Encounter: Payer: Self-pay | Admitting: Family Medicine

## 2013-08-13 ENCOUNTER — Ambulatory Visit (INDEPENDENT_AMBULATORY_CARE_PROVIDER_SITE_OTHER): Payer: Self-pay | Admitting: Family Medicine

## 2013-08-13 VITALS — BP 102/61 | HR 64 | Temp 98.1°F | Wt 141.4 lb

## 2013-08-13 DIAGNOSIS — O24919 Unspecified diabetes mellitus in pregnancy, unspecified trimester: Secondary | ICD-10-CM

## 2013-08-13 LAB — POCT URINALYSIS DIP (DEVICE)
BILIRUBIN URINE: NEGATIVE
GLUCOSE, UA: NEGATIVE mg/dL
HGB URINE DIPSTICK: NEGATIVE
Ketones, ur: NEGATIVE mg/dL
LEUKOCYTES UA: NEGATIVE
NITRITE: NEGATIVE
Protein, ur: NEGATIVE mg/dL
Specific Gravity, Urine: 1.015 (ref 1.005–1.030)
Urobilinogen, UA: 0.2 mg/dL (ref 0.0–1.0)
pH: 6.5 (ref 5.0–8.0)

## 2013-08-13 MED ORDER — ASPIRIN 81 MG PO CHEW: 81.0000 mg | CHEWABLE_TABLET | Freq: Every day | ORAL | Status: DC

## 2013-08-13 NOTE — Progress Notes (Signed)
FBS 79-86 2 hour pp 100-147 2 are out of range On no meds Needs Fetal ECHO scheduled Send for optho eval Start baby ASA

## 2013-08-13 NOTE — Patient Instructions (Signed)
Diabetes mellitus gestacional (Gestational Diabetes Mellitus) La diabetes mellitus gestacional, ms comnmente conocida como diabetes gestacional es un tipo de diabetes que desarrollan algunas mujeres durante el [place]. En la diabetes gestacional, el pncreas no produce suficiente insulina (una hormona) o las clulas son menos sensibles a la insulina producida (resistencia a la insulina), o ambas cosas. Normalmente, la [employer] Corporation azcares de los alimentos a las clulas de los tejidos. Las clulas de los tejidos [employer] azcares para Dealer. La falta de insulina o la falta de una respuesta normal a la insulina hace que el exceso de azcar se acumule en la sangre en lugar de Location manager en las clulas de los tejidos. Como resultado, se producen niveles altos de Dispensing optician (hiperglucemia). El efecto de los niveles altos de Location manager (glucosa) puede causar muchos problemas.  FACTORES DE RIESGO Usted tiene mayor probabilidad de desarrollar diabetes gestacional si tiene antecedentes familiares de diabetes y tambin si tiene uno o ms de los siguientes factores de riesgo:  ndice de masa corporal superior a 30 (obesidad).  Embarazo previo con diabetes gestacional.  La edad avanzada en el momento del embarazo. Si se mantienen los niveles de glucosa en la sangre en un rango normal durante el [place], las mujeres pueden tener un embarazo saludable. Si los niveles de glucosa en la sangre no estn bien controlados, puede haber riesgos para usted, el feto o el recin nacido, o durante el trabajo de parto y [place].  SNTOMAS  Si se presentan sntomas, stos son similares a los sntomas que normalmente experimentar durante el embarazo. Los sntomas de la diabetes gestacional son:   Lovey Newcomer de la sed (polidipsia).  Aumento de la miccin (poliuria).  Orina con ms frecuencia durante la noche (nocturia).  Prdida de peso. La prdida de peso puede ser muy rpida.  Infecciones  frecuentes y recurrentes.  Cansancio (fatiga).  Debilidad.  Cambios en la visin, como visin borrosa.  Olor a Medical illustrator.  Dolor abdominal. DIAGNSTICO La diabetes se diagnostica cuando hay aumento de los niveles de glucosa en la [place]. El nivel de glucosa en la sangre puede controlarse en uno o ms de los siguientes anlisis de sangre:  Medicin de glucosa en la sangre en [place]. No se le permitir comer durante al menos 8 horas antes de que se tome [place] de [place].  Pruebas al azar de glucosa en la sangre. El nivel de glucosa en la sangre se controla en cualquier momento del da sin importar el momento en que haya comido.  Prueba de A1c (hemoglobina glucosilada) Una prueba de A1c proporciona informacin sobre el control de la glucosa en la sangre durante los ltimos 3 meses.  Prueba de tolerancia a la glucosa oral (PTGO). La glucosa en la sangre se mide despus de no haber comido (ayunas) durante una a tres horas y despus de beber una bebida que contenga glucosa. Dado que las hormonas que causan la resistencia a la insulina son ms altas alrededor Navistar International Corporation 24 a 28 de Media planner, generalmente se realiza una PTGO durante ese tiempo. Si tiene factores de riesgo de diabetes gestacional, su mdico puede hacerle estudios de Programme researcher, broadcasting/film/video antes de las 24semanas de [place]. TRATAMIENTO   Usted tendr que tomar medicamentos para la diabetes o insulina diariamente para Theatre manager los niveles de glucosa en la sangre en el rango deseado.  Usted tendr [employer]aya dosis de insulina con la actividad fsica y la eleccin de alimentos saludables. El [place] del  tratamiento es mantener el nivel de azcar en la sangre previo a comer (preprandial) y durante la noche entre 60 y 99mg/dl, durante todo el embarazo. El objetivo del tratamiento es mantener el nivel pico de azcar en la sangre despus de comer (glucosa posprandial) entre 100y 140mg/dl. INSTRUCCIONES PARA EL CUIDADO EN EL  HOGAR   Controle su nivel de hemoglobina A1c dos veces al ao.  Contrlese a diario el nivel de glucosa en la sangre segn las indicaciones de su mdico. Es comn realizar controles frecuentes de la glucosa en la sangre.  Supervise las cetonas en la orina cuando est enferma y segn las indicaciones de su mdico.  Tome el medicamento para la diabetes y adminstrese insulina segn las indicaciones de su mdico para mantener el nivel de glucosa en la sangre en el rango deseado.  Nunca se quede sin medicamento para la diabetes o sin insulina. Es necesario que la reciba todos los das.  Ajuste la insulina segn la ingesta de hidratos de carbono. Los hidratos de carbono pueden aumentar los niveles de glucosa en la sangre, pero deben incluirse en su dieta. Los hidratos de carbono aportan vitaminas, minerales y fibra que son una parte esencial de una dieta saludable. Los hidratos de carbono se encuentran en frutas, verduras, cereales integrales, productos lcteos, legumbres y alimentos que contienen azcares aadidos.  Consuma alimentos saludables. Alterne 3 comidas con 3 colaciones.  Aumente de peso saludablemente. El aumento del peso total vara de acuerdo con el ndice de masa corporal que tena antes del embarazo (IMC).  Lleve una tarjeta de alerta mdica o use una pulsera o medalla de alerta mdica.  Lleve con usted una colacin de 15gramos de hidratos de carbono en todo momento para controlar los niveles bajos de glucosa en la sangre (hipoglucemia). Algunos ejemplos de colaciones de 15gramos de hidratos de carbono son los siguientes:  Tabletas de glucosa, 3 o 4.  Gel de glucosa, tubo de 15 gramos.  Pasas de uva, 2 cucharadas (24 g).  Caramelos de goma, 6.  Galletas de animales, 8.  Jugo de fruta, gaseosa comn, o leche descremada, 4 onzas (120 ml).  Pastillas de goma, 9.  Reconocer la hipoglucemia. Durante el embarazo la hipoglucemia se produce cuando hay niveles de glucosa en la  sangre de 60 mg/dl o menos. El riesgo de hipoglucemia aumenta durante el ayuno o cuando se saltea las comidas, durante o despus de realizar ejercicio intenso y mientras duerme. Los sntomas de hipoglucemia son:  Temblores o sacudidas.  Disminucin de la capacidad de concentracin.  Sudoracin.  Aumento de la frecuencia cardaca.  Dolor de cabeza.  Sequedad en la boca.  Hambre.  Irritabilidad.  Ansiedad.  Sueo agitado.  Alteracin del habla o de la coordinacin.  Confusin.  Tratar la hipoglucemia rpidamente. Si usted est alerta y puede tragar con seguridad, siga la regla de 15/15 que consiste en:  Tome entre 15 y 20gramos de glucosa de accin rpida o carbohidratos. Las opciones de accin rpida son un gel de glucosa, tabletas de glucosa, o 4 onzas (120 ml) de jugo de frutas, gaseosa comn, o leche baja en grasa.  Compruebe su nivel de glucosa en la sangre 15 minutos despus de tomar la glucosa.  Tome entre 15 y 20 gramos ms de glucosa si el nivel de glucosa en la sangre todava es de 70mg/dl o inferior.  Ingiera una comida o una colacin en el lapso de 1 hora una vez que los niveles de glucosa en la sangre vuelven   a la normalidad.  Est atento a la poliuria (miccin excesiva) y la polidipsia (sensacin de mucha sed), que son los primeros signos de la hiperglucemia. El reconocimiento temprano de la hiperglucemia permite un tratamiento oportuno. Trate la hiperglucemia segn le indic su mdico.  Haga actividad fsica por lo menos 35minutos al da o como lo indique su mdico. Se recomienda que 30 minutos despus de cada comida, realice diez minutos de actividad fsica para controlar los niveles de glucosa postprandial en la [place].  Ajuste su dosis de insulina y la ingesta de alimentos, segn sea necesario, si inicia un nuevo ejercicio o deporte.  Siga su plan para los das de enfermedad cuando no pueda comer o beber como de [place].  Evite el tabaco y el  alcohol.  Concurra a todas las visitas de control como se lo haya indicado el mdico.  Siga el consejo del mdico respecto a los controles prenatales y posteriores al parto (postparto), las visitas, la planificacin de las comidas, el ejercicio, los medicamentos, las vitaminas, los anlisis de [place], otras pruebas mdicas y [place] fsicas.  Realice diariamente el cuidado de la piel y de los pies. Examine su piel y los pies diariamente para ver si tiene cortes, moretones, enrojecimiento, problemas en las uas, sangrado, ampollas o Pension scheme manager.  Cepllese los dientes y encas por lo menos dos veces al da y use hilo dental al menos una vez por da. Concurra regularmente a las visitas de control con el dentista.  Programe un examen de vista durante el primer trimestre de su embarazo o como lo indique su mdico.  Comparta su plan de control de diabetes en el trabajo o en la escuela.  [place].  Aprenda a Engineer, maintenance (IT).  Obtenga la mayor cantidad posible de informacin sobre la diabetes y solicite ayuda siempre que sea necesario.  Obtenga informacin sobre el amamantamiento y analice esta posibilidad.  Debe controlar el nivel de azcar en la sangre de 6a 12semanas despus del parto. Esto se hace con una prueba de tolerancia a la glucosa oral (PTGO). SOLICITE ATENCIN MDICA SI:   No puede comer alimentos o beber por ms de 6 horas.  Tuvo nuseas o ha vomitado durante ms de 6 horas.  Tiene un nivel de glucosa en la sangre de 200 mg/dl y cetonas en la orina.  Presenta algn cambio en el estado mental.  Desarrolla problemas de visin.  Sufre un dolor persistente de Pensions consultant.  Siente dolor o molestias en la parte superior del abdomen.  Desarrolla una enfermedad grave adicional.  Tuvo diarrea durante ms de 6 horas.  Ha estado enfermo o ha tenido fiebre durante un par de das y no mejora. SOLICITE ATENCIN MDICA DE INMEDIATO SI:   Tiene dificultad  para respirar.  Ya no siente los movimientos del beb.  Est sangrando o tiene flujo vaginal.  Comienza a tener contracciones o trabajo de [place] prematuro. ASEGRESE DE QUE:  Comprende estas instrucciones.  Controlar su afeccin.  Recibir ayuda de inmediato si no mejora o si empeora. Document Released: 09/30/2004 Document Revised: 05/07/2013 [facility] Patient Information 2015 [place]. This information is not intended to replace advice given to you by your health care provider. Make sure you discuss any questions you have with your health care provider.  Lactancia materna (Breastfeeding) Decidir Economist es una de las mejores elecciones que puede hacer por usted y su beb. El cambio hormonal durante el Media planner produce el desarrollo del tejido mamario y [place] la cantidad  y el tamao de los conductos galactforos. Estas hormonas tambin permiten que las protenas, los azcares y las grasas de la sangre produzcan la Northeast Utilities materna en las glndulas productoras de [place]. Las hormonas impiden que la leche materna sea liberada antes del nacimiento del beb, adems de impulsar el flujo de leche luego del nacimiento. Una vez que ha comenzado a Economist, Freight forwarder beb, as Therapist, occupational succin o Social research officer, government, pueden estimular la liberacin de [place] de las glndulas productoras de [place].  LOS BENEFICIOS DE AMAMANTAR Para el beb  La primera leche (calostro) ayuda a Garment/textile technologist funcionamiento del sistema digestivo del beb.  La leche tiene anticuerpos que ayudan a Chemical engineer las infecciones en el beb.  El beb tiene una menor incidencia de asma, alergias y del sndrome de muerte sbita del lactante.  Los nutrientes en la [place] materna son mejores para el beb que la [place] maternizada y estn preparados exclusivamente para cubrir las necesidades del beb.  La leche materna mejora el desarrollo cerebral del beb.  Es menos probable que el beb desarrolle otras enfermedades, como obesidad  infantil, asma o diabetes mellitus de tipo 2. Para usted   La lactancia materna favorece el desarrollo de un vnculo muy especial entre la madre y el beb.  Es conveniente. La leche materna siempre est disponible a la Tree surgeon y es [place].  La lactancia materna ayuda a quemar caloras y a perder el peso ganado durante el [place].  Favorece la contraccin del tero al tamao que tena antes del embarazo de manera ms rpida y disminuye el sangrado (loquios) despus del parto.  La lactancia materna contribuye a reducir Catering manager de desarrollar diabetes mellitus de tipo 2, osteoporosis o cncer de mama o de ovario en el futuro. SIGNOS DE QUE EL BEB EST HAMBRIENTO Primeros signos de hambre  Aumenta su estado de [place].  Se estira.  Mueve la cabeza de un lado a otro.  Mueve la cabeza y abre la boca cuando se le toca la mejilla o la comisura de la boca (reflejo de bsqueda).  [place] vocalizaciones, tales como sonidos de succin, se relame los labios, emite arrullos, suspiros, o chirridos.  Mueve la [employer] boca.  Se chupa con ganas los dedos o las manos. Signos tardos de [employer].  Llora de manera intermitente. Signos de [employer] signos de hambre extrema requerirn que lo calme y lo consuele antes de que el beb pueda alimentarse adecuadamente. No espere a que se manifiesten los siguientes signos de hambre extrema para comenzar a Economist:   Air cabin crew.  Llanto intenso y fuerte.   Gritos. INFORMACIN BSICA SOBRE LA LACTANCIA MATERNA Iniciacin de la lactancia materna  Encuentre un lugar cmodo para sentarse o acostarse, con un buen respaldo para el cuello y la espalda.  Coloque una almohada o una manta enrollada debajo del beb para acomodarlo a la altura de la mama (si est sentada). Las almohadas para Economist se han diseado especialmente a fin de servir de apoyo para los brazos y el beb Boeing.  Asegrese de que el abdomen del beb est frente al suyo.  Masajee suavemente la mama. Con las yemas de los dedos, masajee la pared del pecho hacia el pezn en un movimiento circular. Esto estimula el flujo de [place]. Es posible que Oceanographer este movimiento mientras amamanta si la leche fluye lentamente.  Sostenga la mama con el pulgar por arriba del pezn y los otros  4 dedos por debajo de la mama. Asegrese de que los dedos se encuentren lejos del pezn y de la boca del beb.  Empuje suavemente los labios del beb con el pezn o con el dedo.  Cuando la boca del beb se abra lo suficiente, acrquelo rpidamente a la mama e introduzca todo el pezn y la zona oscura que lo rodea (areola), tanto como sea posible, dentro de la boca del beb.  Debe haber ms areola visible por arriba del labio superior del beb que por debajo del labio inferior.  La lengua del beb debe estar entre la enca inferior y la [place].  Asegrese de que la boca del beb est en la posicin correcta alrededor del pezn (prendida). Los labios del beb deben crear un sello sobre la mama y estar doblados hacia afuera (invertidos).  Es comn que el beb succione durante 2 a 3 minutos para que comience el flujo de [place]. Cmo debe prenderse Es muy importante que le ensee al beb cmo prenderse adecuadamente a la mama. Si el beb no se prende adecuadamente, puede causarle dolor en el pezn y reducir la produccin de [place], y hacer que el beb tenga un escaso aumento de [place]. Adems, si el beb no se prende adecuadamente al pezn, puede tragar aire durante la alimentacin. Esto puede causarle molestias al beb. Hacer eructar al beb al Eliezer Lofts de mama puede ayudarlo a liberar el aire. Sin embargo, ensearle al beb cmo prenderse a la mama adecuadamente es la mejor manera de evitar que se sienta molesto por tragar Administrator, sports se alimenta. Signos de que el beb se ha prendido adecuadamente al pezn:    Hall Busing o succiona de modo silencioso, sin causarle dolor.  Se escucha que traga cada 3 o 4 succiones.   Hay movimientos musculares por arriba y por delante de sus odos al Mining engineer. Signos de que el beb no se ha prendido Product manager al pezn:   Hace ruidos de succin o de chasquido mientras se alimenta.  Siente dolor en el pezn. Si cree que el beb no se prendi correctamente, deslice el dedo en la comisura de la boca y [employer] Technology las encas del beb para interrumpir la succin. Intente comenzar a amamantar nuevamente. Signos de Economist Signos del beb:   Disminuye gradualmente el nmero de succiones o cesa la succin por completo.  Se duerme.  Relaja el cuerpo.  Retiene una pequea cantidad de [employer]EL Corporation boca.  Se desprende solo del pecho. Signos que presenta usted:  Las mamas han aumentado la firmeza, el peso y el tamao 1 a 3 horas despus de Economist.  Estn ms blandas inmediatamente despus de amamantar.  Un aumento del volumen de [place], y tambin un cambio en su consistencia y color se producen hacia el [place] de Therapist, nutritional.  Los pezones no duelen, ni estn agrietados ni sangran. Signos de que su beb recibe la cantidad de leche suficiente  Moja al menos 3 paales en 24 horas. La orina debe ser clara y de color amarillo plido a los [place].  Defeca al menos 3 veces en 24 horas a los 5 das de vida. La materia fecal debe ser blanda y [place].  Defeca al menos 3 veces en 24 horas a los 7 das de vida. La materia fecal debe ser grumosa y [place].  No registra una prdida de peso mayor del 10% del peso al nacer durante los primeros [place].  Aumenta de peso un promedio de 4 a 7onzas (113 a 198g) por semana despus de los [place].  Aumenta de [place], [place], de [place] uniforme a Proofreader de los 5 das de vida, sin Museum/gallery curator prdida de peso despus de las 2semanas de vida. Despus de  alimentarse, es posible que el beb regurgite una pequea cantidad. Esto es frecuente. FRECUENCIA Y DURACIN DE LA LACTANCIA MATERNA El amamantamiento frecuente la ayudar a producir ms [place] y a Warden/ranger de Social research officer, government en los pezones e hinchazn en las [place]. Alimente al beb cuando muestre signos de hambre o si siente la necesidad de reducir la congestin de las [place]. Esto se denomina "lactancia a demanda". Evite el uso del chupete mientras trabaja para establecer la lactancia (las primeras 4 a 6 semanas despus del nacimiento del beb). Despus de este perodo, podr ofrecerle un chupete. Las investigaciones demostraron que el uso del chupete durante el primer ao de vida del beb disminuye el riesgo de desarrollar el sndrome de muerte sbita del lactante (SMSL). Permita que el nio se alimente en cada mama todo lo que desee. Contine amamantando al beb hasta que haya terminado de alimentarse. Cuando el beb se desprende o se queda dormido mientras se est alimentando de la primera mama, ofrzcale la segunda. Debido a que, con frecuencia, los recin [place] las primeras semanas de vida, es posible que deba despertar al beb para alimentarlo. Los horarios de Writer de un beb a otro. Sin embargo, las siguientes reglas pueden servir como gua para ayudarla a Engineer, materials que el beb se alimenta adecuadamente:  Se puede amamantar a los recin nacidos (bebs de 4 semanas o menos de vida) cada 1 a 3 horas.  No deben transcurrir ms de 3 horas durante el da o 5 horas durante la noche sin que se amamante a los recin nacidos.  Debe amamantar al beb 8 veces como mnimo en un perodo de 24 horas, hasta que comience a introducir slidos en su dieta, a los 6 meses de vida aproximadamente. [place] extraccin y Recruitment consultant de la leche materna le permiten asegurarse de que el beb se alimente exclusivamente de [place], aun en momentos en  los que no puede amamantar. Esto tiene especial importancia si debe regresar al [place] en el perodo en que an est amamantando o si no puede estar presente en los momentos en que el beb debe alimentarse. Su asesor en lactancia puede orientarla sobre cunto tiempo es [place].  El sacaleche es un aparato que le permite extraer leche de la mama a un recipiente estril. Luego, la leche materna extrada puede almacenarse en un refrigerador o Pension scheme manager. Algunos sacaleches son Theodore Demark, James Ivanoff otros son elctricos. Consulte a su asesor en lactancia qu tipo ser ms conveniente para usted. Los sacaleches se pueden comprar; sin embargo, algunos hospitales y grupos de apoyo a la lactancia materna alquilan Production assistant, radio. Un asesor en lactancia puede ensearle cmo extraer [employer], en caso de que prefiera no usar un sacaleche.  CMO CUIDAR LAS MAMAS DURANTE LA LACTANCIA MATERNA Los pezones se secan, agrietan y duelen durante la Therapist, nutritional. Las siguientes recomendaciones pueden ayudarla a Theatre manager las [employer] y sanas:  Art therapist usar jabn en los pezones.  Use un sostn de soporte. Aunque no son esenciales, las camisetas sin mangas o los sostenes especiales para Economist estn diseados para acceder fcilmente a las mamas, para Economist sin tener que quitarse todo  el sostn o la camiseta. Evite usar sostenes con aro o sostenes muy ajustados.  Seque al aire sus pezones durante 3 a 74minutos despus de amamantar al beb.  Utilice solo apsitos de Chiropodist sostn para Tax adviser las prdidas de [place]. La prdida de un poco de Owens Corning tomas es normal.  Utilice lanolina sobre los pezones luego de Economist. La lanolina ayuda a mantener la humedad normal de la piel. Si [place] lanolina pura, no tiene que lavarse los pezones antes de volver a Research scientist (life sciences) al beb. La lanolina pura no es txica para el beb. Adems, puede extraer  Cisco algunas gotas de [place] materna y Community education officer suavemente esa [employer], para que la [place] se seque al aire. Durante las primeras semanas despus de dar a luz, algunas mujeres pueden experimentar hinchazn en las mamas (congestin [place]). La congestin puede hacer que sienta las mamas pesadas, calientes y sensibles al tacto. El pico de la congestin ocurre dentro de los 3 a 5 das despus del [place]. Las siguientes recomendaciones pueden ayudarla a Public house manager la congestin:  Vace por completo las mamas al [place]. Puede aplicar calor hmedo en las mamas (en la ducha o con toallas hmedas para manos) antes de Economist o extraer Northeast Utilities. Esto aumenta la circulacin y [place] a que la [place]. Si el beb no vaca por completo las mamas cuando lo amamanta, extraiga la [place] restante despus de que haya finalizado.  Use un sostn ajustado (para amamantar o comn) o una camiseta sin mangas durante 1 o 2 das para indicar al cuerpo que disminuya ligeramente la produccin de Corning.  Aplique compresas de hielo [employer], a menos que le resulte demasiado incmodo.  Asegrese de que el beb est prendido y se encuentre en la posicin correcta mientras lo alimenta. Si la congestin persiste luego de 48 horas o despus de seguir estas recomendaciones, comunquese con su mdico o un Lobbyist. RECOMENDACIONES GENERALES PARA EL CUIDADO DE LA SALUD DURANTE LA LACTANCIA MATERNA  Consuma alimentos saludables. [place]ne comidas y colaciones, y coma 3 de cada una por da. Dado que lo que come Solectron Corporation, es posible que algunas comidas hagan que su beb se vuelva ms irritable de lo habitual. Evite comer este tipo de alimentos si percibe que afectan de manera negativa al beb.  Beba leche, jugos de fruta y agua para Engineer, water su sed (aproximadamente [place]).  Descanse con frecuencia, reljese y tome sus vitaminas prenatales para evitar la  fatiga, el estrs y la anemia.  Contine con los autocontroles de la mama.  Evite masticar y fumar tabaco.  Evite el consumo de alcohol y drogas. Algunos medicamentos, que pueden ser perjudiciales para el beb, pueden pasar a travs de la SLM Corporation. Es importante que consulte a su mdico antes de Medical sales representative, incluidos todos los medicamentos recetados y de [place], as como los suplementos vitamnicos y herbales. Puede quedar embarazada durante la lactancia. Si desea controlar la natalidad, consulte a su mdico cules son las opciones ms seguras para el beb. SOLICITE ATENCIN MDICA SI:   Usted siente que quiere dejar de Economist o se siente frustrada con la lactancia.  Siente dolor en las mamas o en los pezones.  Sus pezones estn agrietados o Control and instrumentation engineer.  Sus pechos estn irritados, sensibles o calientes.  Tiene un rea hinchada en cualquiera de las mamas.  Siente escalofros o fiebre.  Tiene nuseas o vmitos.  Presenta una secrecin de otro lquido distinto de la leche materna de los pezones.  Sus mamas no se llenan antes de Economist al beb para el quinto da despus del [place].  Se siente triste y deprimida.  El beb est demasiado somnoliento como para comer bien.  El beb tiene problemas para dormir.  Moja menos de 3 paales en 24 horas.  Defeca menos de 3 veces en 24 horas.  La piel del beb o la parte blanca de los ojos se vuelven amarillentas.  El beb no ha aumentado de [place] a los [place]. 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  Le sube la fiebre sin causa. Document Released: 12/21/2004 Document Revised: 12/26/2012 [facility] Patient Information 2015 [place]. This information is not intended to replace advice given to you by your health care provider. Make sure you discuss any questions you have with your health care provider.

## 2013-08-13 NOTE — Progress Notes (Signed)
appt made with koala eye 8/18; fetal echo never done, scheduled for 9/8

## 2013-08-27 ENCOUNTER — Ambulatory Visit (INDEPENDENT_AMBULATORY_CARE_PROVIDER_SITE_OTHER): Payer: Self-pay | Admitting: Obstetrics & Gynecology

## 2013-08-27 VITALS — BP 97/53 | HR 44 | Temp 97.9°F | Wt 141.5 lb

## 2013-08-27 DIAGNOSIS — O0992 Supervision of high risk pregnancy, unspecified, second trimester: Secondary | ICD-10-CM

## 2013-08-27 DIAGNOSIS — O24919 Unspecified diabetes mellitus in pregnancy, unspecified trimester: Secondary | ICD-10-CM

## 2013-08-27 LAB — POCT URINALYSIS DIP (DEVICE)
Bilirubin Urine: NEGATIVE
Glucose, UA: NEGATIVE mg/dL
Hgb urine dipstick: NEGATIVE
Ketones, ur: 80 mg/dL — AB
LEUKOCYTES UA: NEGATIVE
Nitrite: NEGATIVE
PROTEIN: NEGATIVE mg/dL
Specific Gravity, Urine: 1.015 (ref 1.005–1.030)
Urobilinogen, UA: 0.2 mg/dL (ref 0.0–1.0)
pH: 7.5 (ref 5.0–8.0)

## 2013-08-27 NOTE — Progress Notes (Signed)
FBS most <90, few 90-101, PP 1 / day, 110-140. Continue diet control

## 2013-08-27 NOTE — Patient Instructions (Signed)
Segundo trimestre de embarazo (Second Trimester of Pregnancy) El segundo trimestre va desde la semana13 hasta la 28, desde el cuarto hasta el sexto mes, y suele ser el momento en el que mejor se siente. Su organismo se ha adaptado a estar embarazada y comienza a sentirse fsicamente mejor. En general, las nuseas matutinas han disminuido o han desaparecido completamente, p El segundo trimestre es tambin la poca en la que el feto se desarrolla rpidamente. Hacia el final del sexto mes, el feto mide aproximadamente 9pulgadas (23cm) y pesa alrededor de 1 libras (700g). Es probable que sienta que el beb se mueve (da pataditas) entre las 18 y 20semanas del embarazo. CAMBIOS EN EL ORGANISMO Su organismo atraviesa por muchos cambios durante el embarazo, y estos varan de una mujer a otra.   Seguir aumentando de peso. Notar que la parte baja del abdomen sobresale.  Podrn aparecer las primeras estras en las caderas, el abdomen y las mamas.  Es posible que tenga dolores de cabeza que pueden aliviarse con los medicamentos que su mdico autorice.  Tal vez tenga necesidad de orinar con ms frecuencia porque el feto est ejerciendo presin sobre la vejiga.  Debido al embarazo podr sentir acidez estomacal con frecuencia.  Puede estar estreida, ya que ciertas hormonas enlentecen los movimientos de los msculos que empujan los desechos a travs de los intestinos.  Pueden aparecer hemorroides o abultarse e hincharse las venas (venas varicosas).  Puede tener dolor de espalda que se debe al aumento de peso y a que las hormonas del embarazo relajan las articulaciones entre los huesos de la pelvis, y como consecuencia de la modificacin del peso y los msculos que mantienen el equilibrio.  Las mamas seguirn creciendo y le dolern.  Las encas pueden sangrar y estar sensibles al cepillado y al hilo dental.  Pueden aparecer zonas oscuras o manchas (cloasma, mscara del embarazo) en el rostro que  probablemente se atenuarn despus del nacimiento del beb.  Es posible que se forme una lnea oscura desde el ombligo hasta la zona del pubis (linea nigra) que probablemente se atenuarn despus del nacimiento del beb.  Tal vez haya cambios en el cabello que pueden incluir su engrosamiento, crecimiento rpido y cambios en la textura. Adems, a algunas mujeres se les cae el cabello durante o despus del embarazo, o tienen el cabello seco o fino. Lo ms probable es que el cabello se le normalice despus del nacimiento del beb. QU DEBE ESPERAR EN LAS CONSULTAS PRENATALES Durante una visita prenatal de rutina:  La pesarn para asegurarse de que usted y el feto estn creciendo normalmente.  Le tomarn la presin arterial.  Le medirn el abdomen para controlar el desarrollo del beb.  Se escucharn los latidos cardacos fetales.  Se evaluarn los resultados de los estudios solicitados en visitas anteriores. El mdico puede preguntarle lo siguiente:  Cmo se siente.  Si siente los movimientos del beb.  Si ha tenido sntomas anormales, como prdida de lquido, sangrado, dolores de cabeza intensos o clicos abdominales.  Si tiene alguna pregunta. Otros estudios que podrn realizarse durante el segundo trimestre incluyen lo siguiente:  Anlisis de sangre para detectar:  Concentraciones de hierro bajas (anemia).  Diabetes gestacional (entre la semana 24 y la 28).  Anticuerpos Rh.  Anlisis de orina para detectar infecciones, diabetes o protenas en la orina.  Una ecografa para confirmar que el beb crece y se desarrolla correctamente.  Una amniocentesis para diagnosticar posibles problemas genticos.  Estudios del feto para descartar espina   bfida y sndrome de Down. INSTRUCCIONES PARA EL CUIDADO EN EL HOGAR   Evite fumar, consumir hierbas, beber alcohol y tomar frmacos que no le hayan recetado. Estas sustancias qumicas afectan la formacin y el desarrollo del beb.  Siga  las indicaciones del mdico en relacin con el uso de medicamentos. Durante el embarazo, hay medicamentos que son seguros de tomar y otros que no.  Haga actividad fsica solo en la forma indicada por el mdico. Sentir clicos uterinos es un buen signo para detener la actividad fsica.  Contine comiendo alimentos que sanos con regularidad.  Use un sostn que le brinde buen soporte si le duelen las mamas.  No se d baos de inmersin en agua caliente, baos turcos ni saunas.  Colquese el cinturn de seguridad cuando conduzca.  No coma carne cruda ni queso sin cocinar; evite el contacto con las bandejas sanitarias de los gatos y la tierra que estos animales usan. Estos elementos contienen grmenes que pueden causar defectos congnitos en el beb.  Tome las vitaminas prenatales.  Si est estreida, pruebe un laxante suave (si el mdico lo autoriza). Consuma ms alimentos ricos en fibra, como vegetales y frutas frescos y cereales integrales. Beba gran cantidad de lquido para mantener la orina de tono claro o color amarillo plido.  Dese baos de asiento con agua tibia para aliviar el dolor o las molestias causadas por las hemorroides. Use una crema para las hemorroides si el mdico la autoriza.  Si tiene venas varicosas, use medias de descanso. Eleve los pies durante 15minutos, 3 o 4veces por da. Limite la cantidad de sal en su dieta.  No levante objetos pesados, use zapatos de tacones bajos y mantenga una buena postura.  Descanse con las piernas elevadas si tiene calambres o dolor de cintura.  Visite a su dentista si an no lo ha hecho durante el embarazo. Use un cepillo de dientes blando para higienizarse los dientes y psese el hilo dental con suavidad.  Puede seguir manteniendo relaciones sexuales, a menos que el mdico le indique lo contrario.  Concurra a todas las visitas prenatales segn las indicaciones de su mdico. SOLICITE ATENCIN MDICA SI:   Tiene mareos.  Siente  clicos leves, presin en la pelvis o dolor persistente en el abdomen.  Tiene nuseas, vmitos o diarrea persistentes.  Tiene secrecin vaginal con mal olor.  Siente dolor al orinar. SOLICITE ATENCIN MDICA DE INMEDIATO SI:   Tiene fiebre.  Tiene una prdida de lquido por la vagina.  Tiene sangrado o pequeas prdidas vaginales.  Siente dolor intenso o clicos en el abdomen.  Sube o baja de peso rpidamente.  Tiene dificultad para respirar y siente dolor de pecho.  Sbitamente se le hinchan mucho el rostro, las manos, los tobillos, los pies o las piernas.  No ha sentido los movimientos del beb durante una hora.  Siente un dolor de cabeza intenso que no se alivia con medicamentos.  Hay cambios en la visin. Document Released: 09/30/2004 Document Revised: 12/26/2012 [employer] Patient Information 2015 [employer]. This information is not intended to replace advice given to you by your health care provider. Make sure you discuss any questions you have with your health care provider.  

## 2013-09-17 ENCOUNTER — Encounter: Payer: Self-pay | Admitting: Obstetrics & Gynecology

## 2013-09-17 ENCOUNTER — Ambulatory Visit (INDEPENDENT_AMBULATORY_CARE_PROVIDER_SITE_OTHER): Payer: Self-pay | Admitting: Obstetrics & Gynecology

## 2013-09-17 VITALS — BP 107/69 | HR 75 | Temp 98.0°F | Wt 143.3 lb

## 2013-09-17 DIAGNOSIS — O24919 Unspecified diabetes mellitus in pregnancy, unspecified trimester: Secondary | ICD-10-CM

## 2013-09-17 DIAGNOSIS — Z23 Encounter for immunization: Secondary | ICD-10-CM

## 2013-09-17 DIAGNOSIS — O0990 Supervision of high risk pregnancy, unspecified, unspecified trimester: Secondary | ICD-10-CM

## 2013-09-17 DIAGNOSIS — O0992 Supervision of high risk pregnancy, unspecified, second trimester: Secondary | ICD-10-CM

## 2013-09-17 LAB — POCT URINALYSIS DIP (DEVICE)
GLUCOSE, UA: NEGATIVE mg/dL
Hgb urine dipstick: NEGATIVE
Ketones, ur: NEGATIVE mg/dL
Leukocytes, UA: NEGATIVE
Nitrite: NEGATIVE
Protein, ur: 30 mg/dL — AB
Specific Gravity, Urine: 1.02 (ref 1.005–1.030)
Urobilinogen, UA: 0.2 mg/dL (ref 0.0–1.0)
pH: 6.5 (ref 5.0–8.0)

## 2013-09-17 LAB — CBC
HCT: 35.4 % — ABNORMAL LOW (ref 36.0–46.0)
Hemoglobin: 12.2 g/dL (ref 12.0–15.0)
MCH: 33.2 pg (ref 26.0–34.0)
MCHC: 34.5 g/dL (ref 30.0–36.0)
MCV: 96.5 fL (ref 78.0–100.0)
Platelets: 213 10*3/uL (ref 150–400)
RBC: 3.67 MIL/uL — AB (ref 3.87–5.11)
RDW: 13.5 % (ref 11.5–15.5)
WBC: 6.9 10*3/uL (ref 4.0–10.5)

## 2013-09-17 LAB — RPR

## 2013-09-17 LAB — TSH: TSH: 2.612 u[IU]/mL (ref 0.350–4.500)

## 2013-09-17 LAB — HEMOGLOBIN A1C
Hgb A1c MFr Bld: 5.1 % (ref <5.7)
MEAN PLASMA GLUCOSE: 100 mg/dL (ref <117)

## 2013-09-17 MED ORDER — TETANUS-DIPHTH-ACELL PERTUSSIS 5-2.5-18.5 LF-MCG/0.5 IM SUSP: 0.5000 mL | Freq: Once | INTRAMUSCULAR | Status: DC

## 2013-09-17 NOTE — Progress Notes (Signed)
Desires tdap and flu.

## 2013-09-17 NOTE — Patient Instructions (Signed)
Regrese a la clinica cuando tenga su cita. Si tiene problemas o preguntas, llama a la clinica o vaya a la sala [facility] al [facility].    

## 2013-09-17 NOTE — Progress Notes (Signed)
Growth U/S 09/21/13 @ 1015a.  Spanish interpreter present.

## 2013-09-17 NOTE — Progress Notes (Signed)
Patient is Spanish-speaking only, Spanish interpreter present for this encounter. Patient reports she was told to check fastings and only 2 hr PP dinner given that she had weeks of CBGS that were within range Normal fastings, two abnormal values in 2 hr P (131, 140).  Told to occasionally check other postprandials to ensure they are not elevated. Patient had normal fetal ECHO and Optho exams, reports have not been sent to clinic yet. HgA1C and TSH to be checked today. Growth scan ordered. Patient informed that she will start 2x/weekt testing at 32 weeks given A2/B GDM (diagnosed at 13 weeks) Third trimester labs today. Tdap and Flu vaccine today.    No other complaints or concerns.  Labor and fetal movement precautions reviewed.

## 2013-09-18 LAB — HIV ANTIBODY (ROUTINE TESTING W REFLEX): HIV 1&2 Ab, 4th Generation: NONREACTIVE

## 2013-09-19 ENCOUNTER — Encounter: Payer: Self-pay | Admitting: Obstetrics & Gynecology

## 2013-09-21 ENCOUNTER — Ambulatory Visit (HOSPITAL_COMMUNITY)
Admission: RE | Admit: 2013-09-21 | Discharge: 2013-09-21 | Disposition: A | Discharge status: Home / Self Care | Payer: Self-pay | Source: Ambulatory Visit | Attending: Obstetrics & Gynecology | Admitting: Obstetrics & Gynecology

## 2013-09-21 DIAGNOSIS — O24919 Unspecified diabetes mellitus in pregnancy, unspecified trimester: Secondary | ICD-10-CM | POA: Insufficient documentation

## 2013-09-21 DIAGNOSIS — Z3689 Encounter for other specified antenatal screening: Secondary | ICD-10-CM | POA: Insufficient documentation

## 2013-10-01 ENCOUNTER — Ambulatory Visit (INDEPENDENT_AMBULATORY_CARE_PROVIDER_SITE_OTHER): Payer: Self-pay | Admitting: Obstetrics & Gynecology

## 2013-10-01 VITALS — BP 105/57 | HR 68 | Temp 98.2°F | Wt 143.7 lb

## 2013-10-01 DIAGNOSIS — O0990 Supervision of high risk pregnancy, unspecified, unspecified trimester: Secondary | ICD-10-CM

## 2013-10-01 DIAGNOSIS — O24919 Unspecified diabetes mellitus in pregnancy, unspecified trimester: Secondary | ICD-10-CM

## 2013-10-01 DIAGNOSIS — O0992 Supervision of high risk pregnancy, unspecified, second trimester: Secondary | ICD-10-CM

## 2013-10-01 LAB — POCT URINALYSIS DIP (DEVICE)
BILIRUBIN URINE: NEGATIVE
GLUCOSE, UA: NEGATIVE mg/dL
Hgb urine dipstick: NEGATIVE
Ketones, ur: NEGATIVE mg/dL
LEUKOCYTES UA: NEGATIVE
NITRITE: NEGATIVE
Protein, ur: NEGATIVE mg/dL
Specific Gravity, Urine: 1.01 (ref 1.005–1.030)
Urobilinogen, UA: 0.2 mg/dL (ref 0.0–1.0)
pH: 6.5 (ref 5.0–8.0)

## 2013-10-01 NOTE — Patient Instructions (Signed)
Regrese a la clinica cuando tenga su cita. Si tiene problemas o preguntas, llama a la clinica o vaya a la sala [facility] al [facility].    

## 2013-10-01 NOTE — Progress Notes (Signed)
Patient is Spanish-speaking only, Spanish interpreter present for this encounter. Blood sugars are within normal range.  9/18 EFW 58%, normal AFI.  Will start 2x/week testing next visit. No other complaints or concerns.  Labor and fetal movement precautions reviewed.

## 2013-10-01 NOTE — Progress Notes (Signed)
Interpreter Zenda Alpers used during this encounter.

## 2013-10-15 ENCOUNTER — Other Ambulatory Visit: Payer: Self-pay

## 2013-10-16 ENCOUNTER — Ambulatory Visit (INDEPENDENT_AMBULATORY_CARE_PROVIDER_SITE_OTHER): Payer: Self-pay | Admitting: Family

## 2013-10-16 VITALS — BP 107/68 | HR 80 | Wt 144.6 lb

## 2013-10-16 DIAGNOSIS — Z3493 Encounter for supervision of normal pregnancy, unspecified, third trimester: Secondary | ICD-10-CM

## 2013-10-16 LAB — POCT URINALYSIS DIP (DEVICE)
Bilirubin Urine: NEGATIVE
Glucose, UA: NEGATIVE mg/dL
Hgb urine dipstick: NEGATIVE
KETONES UR: NEGATIVE mg/dL
LEUKOCYTES UA: NEGATIVE
Nitrite: NEGATIVE
Protein, ur: NEGATIVE mg/dL
Specific Gravity, Urine: 1.015 (ref 1.005–1.030)
Urobilinogen, UA: 0.2 mg/dL (ref 0.0–1.0)
pH: 7 (ref 5.0–8.0)

## 2013-10-16 NOTE — Progress Notes (Signed)
F 66-92 (3/14 abnl) B 73-173 (1/14 abnl), L 77-123 (2/14), D 90-139 (5/14 abnl).  No questions or concerns.  Began 2x/week fetal testing for presumed pre-gestation diabetes.

## 2013-10-19 ENCOUNTER — Ambulatory Visit (INDEPENDENT_AMBULATORY_CARE_PROVIDER_SITE_OTHER): Payer: Self-pay | Admitting: *Deleted

## 2013-10-19 VITALS — BP 98/53 | HR 88

## 2013-10-19 DIAGNOSIS — O24913 Unspecified diabetes mellitus in pregnancy, third trimester: Secondary | ICD-10-CM

## 2013-10-19 LAB — US OB FOLLOW UP

## 2013-10-19 NOTE — Progress Notes (Signed)
Interpreter Truitt Merle present for visit today.

## 2013-10-20 NOTE — Progress Notes (Signed)
NST performed today was reviewed and was found to be reactive.  AFI normal at 15 cm.  Continue recommended antenatal testing and prenatal care.

## 2013-10-22 ENCOUNTER — Ambulatory Visit (INDEPENDENT_AMBULATORY_CARE_PROVIDER_SITE_OTHER): Payer: Self-pay | Admitting: Obstetrics & Gynecology

## 2013-10-22 VITALS — BP 111/61 | HR 68 | Wt 145.7 lb

## 2013-10-22 DIAGNOSIS — O24813 Other pre-existing diabetes mellitus in pregnancy, third trimester: Secondary | ICD-10-CM

## 2013-10-22 NOTE — Progress Notes (Signed)
Still normal fastings and 2 hr pp rarely above 120. States doing well with diet  NST reactive and will have f/u US next week

## 2013-10-22 NOTE — Patient Instructions (Signed)
Third Trimester of Pregnancy The third trimester is from week 29 through week 42, months 7 through 9. The third trimester is a time when the fetus is growing rapidly. At the end of the ninth month, the fetus is about 20 inches in length and weighs 6-10 pounds.  BODY CHANGES Your body goes through many changes during pregnancy. The changes vary from woman to woman.   Your weight will continue to increase. You can expect to gain 25-35 pounds (11-16 kg) by the end of the pregnancy.  You may begin to get stretch marks on your hips, abdomen, and breasts.  You may urinate more often because the fetus is moving lower into your pelvis and pressing on your bladder.  You may develop or continue to have heartburn as a result of your pregnancy.  You may develop constipation because certain hormones are causing the muscles that push waste through your intestines to slow down.  You may develop hemorrhoids or swollen, bulging veins (varicose veins).  You may have pelvic pain because of the weight gain and pregnancy hormones relaxing your joints between the bones in your pelvis. Backaches may result from overexertion of the muscles supporting your posture.  You may have changes in your hair. These can include thickening of your hair, rapid growth, and changes in texture. Some women also have hair loss during or after pregnancy, or hair that feels dry or thin. Your hair will most likely return to normal after your baby is born.  Your breasts will continue to grow and be tender. A yellow discharge may leak from your breasts called colostrum.  Your belly button may stick out.  You may feel short of breath because of your expanding uterus.  You may notice the fetus "dropping," or moving lower in your abdomen.  You may have a bloody mucus discharge. This usually occurs a few days to a week before labor begins.  Your cervix becomes thin and soft (effaced) near your due date. WHAT TO EXPECT AT YOUR PRENATAL  EXAMS  You will have prenatal exams every 2 weeks until week 36. Then, you will have weekly prenatal exams. During a routine prenatal visit:  You will be weighed to make sure you and the fetus are growing normally.  Your blood pressure is taken.  Your abdomen will be measured to track your baby's growth.  The fetal heartbeat will be listened to.  Any test results from the previous visit will be discussed.  You may have a cervical check near your due date to see if you have effaced. At around 36 weeks, your caregiver will check your cervix. At the same time, your caregiver will also perform a test on the secretions of the vaginal tissue. This test is to determine if a type of bacteria, Group B streptococcus, is present. Your caregiver will explain this further. Your caregiver may ask you:  What your birth plan is.  How you are feeling.  If you are feeling the baby move.  If you have had any abnormal symptoms, such as leaking fluid, bleeding, severe headaches, or abdominal cramping.  If you have any questions. Other tests or screenings that may be performed during your third trimester include:  Blood tests that check for low iron levels (anemia).  Fetal testing to check the health, activity level, and growth of the fetus. Testing is done if you have certain medical conditions or if there are problems during the pregnancy. FALSE LABOR You may feel small, irregular contractions that   eventually go away. These are called Braxton Hicks contractions, or false labor. Contractions may last for hours, days, or even weeks before true labor sets in. If contractions come at regular intervals, intensify, or become painful, it is best to be seen by your caregiver.  SIGNS OF LABOR   Menstrual-like cramps.  Contractions that are 5 minutes apart or less.  Contractions that start on the top of the uterus and spread down to the lower abdomen and back.  A sense of increased pelvic pressure or back  pain.  A watery or bloody mucus discharge that comes from the vagina. If you have any of these signs before the 37th week of pregnancy, call your caregiver right away. You need to go to the hospital to get checked immediately. HOME CARE INSTRUCTIONS   Avoid all smoking, herbs, alcohol, and unprescribed drugs. These chemicals affect the formation and growth of the baby.  Follow your caregiver's instructions regarding medicine use. There are medicines that are either safe or unsafe to take during pregnancy.  Exercise only as directed by your caregiver. Experiencing uterine cramps is a good sign to stop exercising.  Continue to eat regular, healthy meals.  Wear a good support bra for breast tenderness.  Do not use hot tubs, steam rooms, or saunas.  Wear your seat belt at all times when driving.  Avoid raw meat, uncooked cheese, cat litter boxes, and soil used by cats. These carry germs that can cause birth defects in the baby.  Take your prenatal vitamins.  Try taking a stool softener (if your caregiver approves) if you develop constipation. Eat more high-fiber foods, such as fresh vegetables or fruit and whole grains. Drink plenty of fluids to keep your urine clear or pale yellow.  Take warm sitz baths to soothe any pain or discomfort caused by hemorrhoids. Use hemorrhoid cream if your caregiver approves.  If you develop varicose veins, wear support hose. Elevate your feet for 15 minutes, 3-4 times a day. Limit salt in your diet.  Avoid heavy lifting, wear low heal shoes, and practice good posture.  Rest a lot with your legs elevated if you have leg cramps or low back pain.  Visit your dentist if you have not gone during your pregnancy. Use a soft toothbrush to brush your teeth and be gentle when you floss.  A sexual relationship may be continued unless your caregiver directs you otherwise.  Do not travel far distances unless it is absolutely necessary and only with the approval  of your caregiver.  Take prenatal classes to understand, practice, and ask questions about the labor and delivery.  Make a trial run to the hospital.  Pack your hospital bag.  Prepare the baby's nursery.  Continue to go to all your prenatal visits as directed by your caregiver. SEEK MEDICAL CARE IF:  You are unsure if you are in labor or if your water has broken.  You have dizziness.  You have mild pelvic cramps, pelvic pressure, or nagging pain in your abdominal area.  You have persistent nausea, vomiting, or diarrhea.  You have a bad smelling vaginal discharge.  You have pain with urination. SEEK IMMEDIATE MEDICAL CARE IF:   You have a fever.  You are leaking fluid from your vagina.  You have spotting or bleeding from your vagina.  You have severe abdominal cramping or pain.  You have rapid weight loss or gain.  You have shortness of breath with chest pain.  You notice sudden or extreme swelling   of your face, hands, ankles, feet, or legs.  You have not felt your baby move in over an hour.  You have severe headaches that do not go away with medicine.  You have vision changes. Document Released: 12/15/2000 Document Revised: 12/26/2012 Document Reviewed: 02/22/2012 ExitCare Patient Information 2015 [employer]. This information is not intended to replace advice given to you by your health care provider. Make sure you discuss any questions you have with your health care provider.  

## 2013-10-26 ENCOUNTER — Ambulatory Visit (INDEPENDENT_AMBULATORY_CARE_PROVIDER_SITE_OTHER): Payer: Self-pay | Admitting: *Deleted

## 2013-10-26 VITALS — BP 105/67 | HR 76

## 2013-10-26 DIAGNOSIS — O24913 Unspecified diabetes mellitus in pregnancy, third trimester: Secondary | ICD-10-CM

## 2013-10-26 LAB — US OB FOLLOW UP

## 2013-10-29 ENCOUNTER — Ambulatory Visit (INDEPENDENT_AMBULATORY_CARE_PROVIDER_SITE_OTHER): Payer: Self-pay | Admitting: Family

## 2013-10-29 VITALS — BP 107/65 | HR 76 | Wt 146.7 lb

## 2013-10-29 DIAGNOSIS — O24913 Unspecified diabetes mellitus in pregnancy, third trimester: Secondary | ICD-10-CM

## 2013-10-29 LAB — POCT URINALYSIS DIP (DEVICE)
BILIRUBIN URINE: NEGATIVE
GLUCOSE, UA: NEGATIVE mg/dL
Hgb urine dipstick: NEGATIVE
Ketones, ur: NEGATIVE mg/dL
Leukocytes, UA: NEGATIVE
Nitrite: NEGATIVE
Protein, ur: NEGATIVE mg/dL
SPECIFIC GRAVITY, URINE: 1.015 (ref 1.005–1.030)
Urobilinogen, UA: 1 mg/dL (ref 0.0–1.0)
pH: 7 (ref 5.0–8.0)

## 2013-10-29 NOTE — Progress Notes (Signed)
FBS 77-94 (1/10) B 78-127 (1/10) L 85-140 (4/10) D 93-140 (5/10).  Next growth ultrasound scheduled for 11/02/13.  Pt reports increased GERD, reviewed diet and patient instructions included.

## 2013-10-29 NOTE — Patient Instructions (Addendum)
PEPCID OR ZANTAC  Acidez de Journalist, newspaper  (Heartburn During Pregnancy ) La acidez es la sensacin de ardor en el pecho que se siente cuando el cido del estmago vuelve haca el esfago. La acidez es frecuente en el embarazo debido a la liberacin de cierta hormona (progesterona). La progesterona relaja la vlvula que separa el esfago del [place]. Esto hace que el cido suba al esfago y cause acidez. Tambin puede ocurrir Geologist, engineering debido a que el tero al agrandarse empuja el estmago, lo que hace que suba ms cido al esfago. Esto se produce especialmente en las ltimas etapas del embarazo. La acidez generalmente desaparece despus del parto. CAUSAS  La acidez se siente cuando el cido del estmago vuelve hacia el esfago. Durante el [place], puede ser causada por distintas cosas, por ejemplo:   La hormona progesterona.  Cambios en los niveles hormonales.  El tero crece y empuja el cido del estmago [place].  Comidas abundantes  Ciertos alimentos y [address] fsica.  Aumento en la produccin de cido SIGNOS Y SNTOMAS   Sensacin de ardor en el pecho o en la parte inferior de la garganta.  Sabor amargo en la boca.  Tos. DIAGNSTICO  El mdico diagnostica la Victorio Palm con una historia clnica cuidadosa en la que pregunta por sus molestias. Le indicar anlisis de sangre para Pension scheme manager cierto tipo de bacteria que se asocia con la [place]. En algunos casos se diagnostica recetando un medicamento para calmar la acidez y viendo si los sntomas mejoran. En algunos casos, se realiza un procedimiento llamado endoscopa. En este procedimiento se [place] un tubo con Ardelia Mems luz y Carlota Raspberry en un extremo (endoscopio) , y se examina el esfago y Product manager. TRATAMIENTO  El tratamiento variar segn la gravedad de los sntomas. El mdico podr indicar:  Medicamentos de Radio broadcast assistant (anticidos, medicamentos para Armed forces technical officer Geographical information systems officer) en los casos de acidez  leve.  Medicamentos recetados para disminuir el cido estomacal o para proteger la superficie del [place].  Ciertos cambios en la dieta.  Elevacin de la cabecera de la cama colocando bloques debajo de las patas. De esta manera evitar que el cido del estmago vuelva al esfago mientras est recostado. [place] slo medicamentos de venta libre o recetados, segn las indicaciones del mdico.  Eleve la cabecera de la cama colocando bloques debajo de las patas, si el mdico lo aconsej. Usar ms almohadas al dormir no es Occupational psychologist porque solo modificara la posicin de la cabeza.  No  haga ejercicios enseguida despus de comer.  Evite comer 2 o 3horas antes de irse a dormir. No se acueste enseguida despus de comer.  Haga comidas pequeas Medical sales representative de tres comidas abundantes.  Identifique los alimentos o las bebidas que empeoran sus sntomas y evtelos. Los alimentos que debe evitar son:  [place].  Chocolate.  Alimentos con alto contenido de grasas, incluyendo las comidas fritas  Comidas muy condimentadas.  Ajo y cebolla  Ctricos, como naranja, pomelo, limn y lima  Alimentos o productos que contengan tomate  Menta.  Bebidas gaseosas y con cafena.  [place] SOLICITE ATENCIN MDICA SI:  Tiene cualquier tipo de dolor abdominal.  Siente ardor en la parte superior del abdomen o el pecho, especialmente despus de comer o mientras est acostada.  Tiene nuseas o vmitos.  Siente malestar estomacal despus de comer. SOLICITE ATENCIN MDICA DE INMEDIATO SI:   Siente un dolor  intenso en el pecho que baja por el brazo o va hacia al mandbula o el cuello.  Se siente mareado o sufre un desmayo.  Comienza a sentir falta de [place].  Vomita sangre.  Tiene dificultad o dolor al tragar.  La materia fecal es negra, de aspecto alquitranado.  Tiene acidez ms de 3 veces por semana, durante ms de 2 semanas. ASEGRESE  DE QUE:  Comprende estas instrucciones.  Controlar su afeccin.  Recibir ayuda de inmediato si no mejora o si empeora. Document Released: 09/30/2004 Document Revised: 10/11/2012 [facility] Patient Information 2015 [place]. This information is not intended to replace advice given to you by your health care provider. Make sure you discuss any questions you have with your health care provi

## 2013-10-29 NOTE — Progress Notes (Signed)
[place] for growth scheduled 10/30 per guideline.

## 2013-11-02 ENCOUNTER — Ambulatory Visit (HOSPITAL_COMMUNITY)
Admission: RE | Admit: 2013-11-02 | Discharge: 2013-11-02 | Disposition: A | Discharge status: Home / Self Care | Payer: Self-pay | Source: Ambulatory Visit | Attending: Obstetrics & Gynecology | Admitting: Obstetrics & Gynecology

## 2013-11-02 ENCOUNTER — Ambulatory Visit (INDEPENDENT_AMBULATORY_CARE_PROVIDER_SITE_OTHER): Payer: Self-pay | Admitting: *Deleted

## 2013-11-02 VITALS — BP 108/58 | HR 69

## 2013-11-02 DIAGNOSIS — O24113 Pre-existing diabetes mellitus, type 2, in pregnancy, third trimester: Secondary | ICD-10-CM | POA: Insufficient documentation

## 2013-11-02 DIAGNOSIS — O24813 Other pre-existing diabetes mellitus in pregnancy, third trimester: Secondary | ICD-10-CM

## 2013-11-02 DIAGNOSIS — Z3A35 35 weeks gestation of pregnancy: Secondary | ICD-10-CM | POA: Insufficient documentation

## 2013-11-02 DIAGNOSIS — E119 Type 2 diabetes mellitus without complications: Secondary | ICD-10-CM | POA: Insufficient documentation

## 2013-11-02 DIAGNOSIS — O24913 Unspecified diabetes mellitus in pregnancy, third trimester: Secondary | ICD-10-CM

## 2013-11-02 NOTE — Progress Notes (Signed)
US for growth today 

## 2013-11-05 ENCOUNTER — Encounter: Payer: Self-pay | Admitting: Obstetrics & Gynecology

## 2013-11-05 ENCOUNTER — Ambulatory Visit (INDEPENDENT_AMBULATORY_CARE_PROVIDER_SITE_OTHER): Payer: Self-pay | Admitting: Obstetrics & Gynecology

## 2013-11-05 ENCOUNTER — Other Ambulatory Visit: Payer: Self-pay | Admitting: Obstetrics & Gynecology

## 2013-11-05 VITALS — BP 106/64 | HR 78 | Temp 98.1°F | Wt 145.6 lb

## 2013-11-05 DIAGNOSIS — O0992 Supervision of high risk pregnancy, unspecified, second trimester: Secondary | ICD-10-CM

## 2013-11-05 DIAGNOSIS — O24913 Unspecified diabetes mellitus in pregnancy, third trimester: Secondary | ICD-10-CM

## 2013-11-05 DIAGNOSIS — O24313 Unspecified pre-existing diabetes mellitus in pregnancy, third trimester: Secondary | ICD-10-CM

## 2013-11-05 LAB — POCT URINALYSIS DIP (DEVICE)
BILIRUBIN URINE: NEGATIVE
GLUCOSE, UA: NEGATIVE mg/dL
Hgb urine dipstick: NEGATIVE
KETONES UR: NEGATIVE mg/dL
Leukocytes, UA: NEGATIVE
Nitrite: NEGATIVE
Protein, ur: NEGATIVE mg/dL
Urobilinogen, UA: 0.2 mg/dL (ref 0.0–1.0)
pH: 5.5 (ref 5.0–8.0)

## 2013-11-05 LAB — OB RESULTS CONSOLE GBS: STREP GROUP B AG: NEGATIVE

## 2013-11-05 NOTE — Progress Notes (Addendum)
Patient is Spanish-speaking only, Spanish interpreter present for this encounter. Blood sugars are within range, just a couple 130s after dinner.  Continue diet adherence. 02/77 EFW 41%, cephalic, normal AFI.  Pelvic cultures done today. NST performed today was reviewed and was found to be reactive.  Continue recommended antenatal testing and prenatal care. No other complaints or concerns.  Labor and fetal movement precautions reviewed.

## 2013-11-05 NOTE — Patient Instructions (Signed)
Regrese a la clinica cuando tenga su cita. Si tiene problemas o preguntas, llama a la clinica o vaya a la sala [facility] al [facility].    

## 2013-11-05 NOTE — Progress Notes (Signed)
[place] for growth done 10/30.  Interpreter Zenda Alpers present for visit.

## 2013-11-06 LAB — GC/CHLAMYDIA PROBE AMP
CT PROBE, AMP APTIMA: NEGATIVE
GC PROBE AMP APTIMA: NEGATIVE

## 2013-11-07 LAB — CULTURE, BETA STREP (GROUP B ONLY)

## 2013-11-07 NOTE — Progress Notes (Signed)
NST reviewed and reactive.  Kairav Russomanno L. Harraway-Smith, M.D., FACOG    

## 2013-11-09 ENCOUNTER — Ambulatory Visit (INDEPENDENT_AMBULATORY_CARE_PROVIDER_SITE_OTHER): Payer: Self-pay | Admitting: *Deleted

## 2013-11-09 VITALS — BP 108/69 | HR 79

## 2013-11-09 DIAGNOSIS — O24913 Unspecified diabetes mellitus in pregnancy, third trimester: Secondary | ICD-10-CM

## 2013-11-09 DIAGNOSIS — O24313 Unspecified pre-existing diabetes mellitus in pregnancy, third trimester: Secondary | ICD-10-CM

## 2013-11-09 LAB — US OB FOLLOW UP

## 2013-11-09 NOTE — Progress Notes (Signed)
NST performed today was reviewed and was found to be reactive.  AFI normal at 11 cm.  Continue recommended antenatal testing and prenatal care.

## 2013-11-12 ENCOUNTER — Ambulatory Visit (INDEPENDENT_AMBULATORY_CARE_PROVIDER_SITE_OTHER): Payer: Self-pay | Admitting: Obstetrics & Gynecology

## 2013-11-12 VITALS — BP 111/63 | HR 83 | Wt 145.7 lb

## 2013-11-12 DIAGNOSIS — O24313 Unspecified pre-existing diabetes mellitus in pregnancy, third trimester: Secondary | ICD-10-CM

## 2013-11-12 DIAGNOSIS — O24913 Unspecified diabetes mellitus in pregnancy, third trimester: Secondary | ICD-10-CM

## 2013-11-12 LAB — POCT URINALYSIS DIP (DEVICE)
Bilirubin Urine: NEGATIVE
Glucose, UA: NEGATIVE mg/dL
Hgb urine dipstick: NEGATIVE
KETONES UR: NEGATIVE mg/dL
LEUKOCYTES UA: NEGATIVE
Nitrite: NEGATIVE
PROTEIN: NEGATIVE mg/dL
Specific Gravity, Urine: 1.02 (ref 1.005–1.030)
Urobilinogen, UA: 0.2 mg/dL (ref 0.0–1.0)
pH: 6.5 (ref 5.0–8.0)

## 2013-11-12 NOTE — Progress Notes (Signed)
Patient is Spanish-speaking only, Spanish interpreter present for this encounter. Blood sugars are within range, just one 140 after dinner. Continue diet adherence. GBS, GC/Chlam all negative from last visit. NST performed today was reviewed and was found to be reactive. Continue recommended antenatal testing and prenatal care. No other complaints or concerns. Labor and fetal movement precautions reviewed.

## 2013-11-16 ENCOUNTER — Ambulatory Visit (INDEPENDENT_AMBULATORY_CARE_PROVIDER_SITE_OTHER): Payer: Self-pay | Admitting: *Deleted

## 2013-11-16 VITALS — BP 110/67 | HR 73

## 2013-11-16 DIAGNOSIS — O24313 Unspecified pre-existing diabetes mellitus in pregnancy, third trimester: Secondary | ICD-10-CM

## 2013-11-16 DIAGNOSIS — O24913 Unspecified diabetes mellitus in pregnancy, third trimester: Secondary | ICD-10-CM

## 2013-11-16 LAB — US OB FOLLOW UP

## 2013-11-19 ENCOUNTER — Ambulatory Visit (INDEPENDENT_AMBULATORY_CARE_PROVIDER_SITE_OTHER): Payer: Self-pay | Admitting: Family Medicine

## 2013-11-19 VITALS — BP 114/63 | HR 79 | Wt 146.2 lb

## 2013-11-19 DIAGNOSIS — O24313 Unspecified pre-existing diabetes mellitus in pregnancy, third trimester: Secondary | ICD-10-CM

## 2013-11-19 DIAGNOSIS — O24913 Unspecified diabetes mellitus in pregnancy, third trimester: Secondary | ICD-10-CM

## 2013-11-19 DIAGNOSIS — O0992 Supervision of high risk pregnancy, unspecified, second trimester: Secondary | ICD-10-CM

## 2013-11-19 LAB — POCT URINALYSIS DIP (DEVICE)
Bilirubin Urine: NEGATIVE
GLUCOSE, UA: NEGATIVE mg/dL
Hgb urine dipstick: NEGATIVE
Ketones, ur: NEGATIVE mg/dL
Nitrite: NEGATIVE
Protein, ur: NEGATIVE mg/dL
Specific Gravity, Urine: 1.02 (ref 1.005–1.030)
UROBILINOGEN UA: 0.2 mg/dL (ref 0.0–1.0)
pH: 6.5 (ref 5.0–8.0)

## 2013-11-19 NOTE — Progress Notes (Signed)
Interpreter Zenda Alpers present for check-in and nurse assessment.  IOL scheduled 11/24 @ 0700 per guideline.

## 2013-11-19 NOTE — Progress Notes (Signed)
Fasting - 1:7 above 90.   2hr PP - 2:21 above 120.   Category 1 tracing with baseline in 120-130s.  Moderate variability, multiple accelerations, no decelerations. Patient without complaints.  Denies vaginal bleeding, abnormal vaginal discharge, contractions, loss of fluid.  Denies abdominal pain, headache, scotoma.  Reports good fetal activity.  Induction scheduled for 39 weeks.  [place] scheduled to follow growth.

## 2013-11-19 NOTE — Patient Instructions (Signed)
Induccin del trabajo de parto  (Labor Induction ) Se denomina induccin del trabajo de parto cuando se inician acciones para hacer que una mujer embarazada comience el trabajo de parto. La mayora de las mujeres comienzan el trabajo de parto sin ayuda entre las semanas 37 y 42 del embarazo. Cuando esto no ocurre o cuando hay una necesidad mdica, pueden utilizarse diferentes mtodos para inducirlo. La induccin del trabajo de parto hace que el tero se contraiga. Tambin hace que el cuello del tero se ablandemadure), se abra (se dilate), y se afine (se borre). Generalmente el trabajo de parto no se induce antes de las 39 semanas excepto que haya un problema con el beb o con la madre.  Antes de inducir el trabajo de parto, el mdico considerar cierto nmero de factores incluyendo los siguientes:  El estado del beb.  Cuntas semanas tiene de embarazo.  La madurez de los pulmones del beb.  El estado del cuello del tero.  La posicin del beb. CULES SON LOS MOTIVOS PARA INDUCIR UN PARTO? El trabajo de parto puede inducirse por las siguientes razones:  La salud del beb o de la madre estn en riesgo.  El embarazo se ha pasado de trmino en 1 semana o ms.  Ha roto la bolsa de aguas pero no se ha iniciado el trabajo de parto por s mismo.  La madre tiene algn trastorno de salud o una enfermedad grave, como hipertensin arterial, una infeccin, desprendimiento abrupto de la placenta o diabetes.  Hay escaso lquido amnitico alrededor del beb.  El beb presenta sufrimiento. La conveniencia o el deseo de que el beb nazca en una cierta fecha no es un motivo para inducir el parto. CULES SON LOS MTODOS UTILIZADOS PARA INDUCIR EL TRABAJO DE PARTO? Algunos mtodos de induccin del trabajo de parto son:   Administracin del medicamentos prostaglandina. Este medicamento hace que el cuello uterino se dilate y madure. Este medicamento tambin iniciar las contracciones. Puede tomarse por  boca o insertarse en la vagina en forma de supositorio.  Insercin en la vagina de un tubo delgado (catter) con un baln en el extremo para dilatar el cuello del tero. Una vez insertado, el baln se infla con agua, lo que provoca la apertura del cuello del tero.  Ruptura de las membranas. El mdico separa el saco amnitico del cuello uterino, haciendo que el cuello uterino se distienda y cause la liberacin de la hormona llamada progesterona. Esto hace que el tero se contraiga. Este procedimiento se realiza durante una visita al consultorio mdico. Le indicarn que vuelva a su casa y espere que se inicien las contracciones. Luego tendr que volver para la induccin.  Ruptura de la bolsa de aguas. El mdico romper el saco amnitico con un pequeo instrumento. Una vez que el saco amnitico se rompe, las contracciones deben comenzar. Pueden pasar algunas horas hasta que haga efecto.  Medicamentos que desencadenen o intensifiquen las contracciones. Se lo administrarn a travs de un catter por va intravenosa (IV) que se inserta en una de las venas del brazo. Todos los mtodos de induccin, excepto la ruptura de membranas, se realizan en el hospital. La induccin se realizar en el hospital, de modo que usted y el beb puedan ser controlados cuidadosamente.  CUNTO TIEMPO LLEVA INDUCIR EL TRABAJO DE PARTO? Algunas inducciones pueden demorar entre 2 y 3 das. Generalmente lleva menos tiempo, dependiendo del estado del cuello del tero. Puede tomar ms tiempo si la induccin se realiza en etapas tempranas del embarazo   o es su primer embarazo. Si han pasado 2 o 3 das y no se inicia el trabajo de parto, podrn enviarla a su casa o realizar una cesrea. CULES SON LOS RIESGOS ASOCIADOS CON LA INDUCCiN DEL TRABAJO DE PARTO? Algunos de los riesgos de la induccin son:   Cambios en la frecuencia cardaca fetal, por ejemplo los latidos son demasiado rpidos, o lentos, o errticos.  Riesgo de distrs  fetal.  Posibilidad de infeccin en la madre o el beb.  Aumento de la posibilidad de que sea necesaria una cesrea.  Ruptura (abrupcin) de la placenta del tero (raro).  Ruptura uterina (muy raro). Cuando es necesario realizar la induccin por razones mdicas, los beneficios deben superar a los riesgos. CULES SON ALGUNAS RAZONES PARA NO INDUCIR EL TRABAJO DE PARTO? La induccin no debe realizarse si:   Se demuestra que el beb no tolera el trabajo de parto.  Fue sometida anteriormente a cirugas en el tero, como una miomectoma o le han extirpado fibromas.  La placenta est en una posicin muy baja en el tero y obstruye la abertura del cuello (placenta previa).  El beb no est ubicado con la cabeza hacia bajo.  El cordn umbilical cae hacia el canal de parto, adelante del beb. Esto puede cortar el suministro de sangre y oxgeno al beb.  Fue sometida a una cesrea anteriormente.  Hay circunstancias poco habituales, como que el beb es extremadamente prematuro. Document Released: 03/30/2007 Document Revised: 08/23/2012 ExitCare Patient Information 2015 [employer]. This information is not intended to replace advice given to you by your health care provider. Make sure you discuss any questions you have with your health care provider.  

## 2013-11-23 ENCOUNTER — Encounter (HOSPITAL_COMMUNITY): Payer: Self-pay | Admitting: *Deleted

## 2013-11-23 ENCOUNTER — Ambulatory Visit (HOSPITAL_COMMUNITY)
Admission: RE | Admit: 2013-11-23 | Discharge: 2013-11-23 | Disposition: A | Discharge status: Home / Self Care | Payer: Self-pay | Source: Ambulatory Visit | Attending: Family Medicine | Admitting: Family Medicine

## 2013-11-23 ENCOUNTER — Telehealth (HOSPITAL_COMMUNITY): Payer: Self-pay | Admitting: *Deleted

## 2013-11-23 ENCOUNTER — Inpatient Hospital Stay (HOSPITAL_COMMUNITY)
Admission: AD | Admit: 2013-11-23 | Discharge: 2013-11-25 | DRG: 775 | Disposition: A | Discharge status: Home / Self Care | Payer: Medicaid Other | Source: Ambulatory Visit | Attending: Obstetrics & Gynecology | Admitting: Obstetrics & Gynecology

## 2013-11-23 ENCOUNTER — Ambulatory Visit (INDEPENDENT_AMBULATORY_CARE_PROVIDER_SITE_OTHER): Payer: Self-pay | Admitting: *Deleted

## 2013-11-23 VITALS — BP 107/72 | HR 76

## 2013-11-23 DIAGNOSIS — O288 Other abnormal findings on antenatal screening of mother: Secondary | ICD-10-CM | POA: Insufficient documentation

## 2013-11-23 DIAGNOSIS — O24913 Unspecified diabetes mellitus in pregnancy, third trimester: Secondary | ICD-10-CM

## 2013-11-23 DIAGNOSIS — IMO0002 Reserved for concepts with insufficient information to code with codable children: Secondary | ICD-10-CM | POA: Diagnosis present

## 2013-11-23 DIAGNOSIS — O24420 Gestational diabetes mellitus in childbirth, diet controlled: Secondary | ICD-10-CM | POA: Diagnosis present

## 2013-11-23 DIAGNOSIS — Z833 Family history of diabetes mellitus: Secondary | ICD-10-CM | POA: Diagnosis not present

## 2013-11-23 DIAGNOSIS — E119 Type 2 diabetes mellitus without complications: Secondary | ICD-10-CM | POA: Insufficient documentation

## 2013-11-23 DIAGNOSIS — O289 Unspecified abnormal findings on antenatal screening of mother: Secondary | ICD-10-CM | POA: Insufficient documentation

## 2013-11-23 DIAGNOSIS — Z3A38 38 weeks gestation of pregnancy: Secondary | ICD-10-CM | POA: Insufficient documentation

## 2013-11-23 DIAGNOSIS — O0992 Supervision of high risk pregnancy, unspecified, second trimester: Secondary | ICD-10-CM

## 2013-11-23 DIAGNOSIS — O133 Gestational [pregnancy-induced] hypertension without significant proteinuria, third trimester: Secondary | ICD-10-CM

## 2013-11-23 DIAGNOSIS — Z8614 Personal history of Methicillin resistant Staphylococcus aureus infection: Secondary | ICD-10-CM | POA: Diagnosis not present

## 2013-11-23 DIAGNOSIS — O24313 Unspecified pre-existing diabetes mellitus in pregnancy, third trimester: Secondary | ICD-10-CM

## 2013-11-23 DIAGNOSIS — O9989 Other specified diseases and conditions complicating pregnancy, childbirth and the puerperium: Secondary | ICD-10-CM | POA: Diagnosis present

## 2013-11-23 DIAGNOSIS — O24113 Pre-existing diabetes mellitus, type 2, in pregnancy, third trimester: Secondary | ICD-10-CM | POA: Insufficient documentation

## 2013-11-23 LAB — CBC
HEMATOCRIT: 38.1 % (ref 36.0–46.0)
HEMOGLOBIN: 13.5 g/dL (ref 12.0–15.0)
MCH: 34.4 pg — ABNORMAL HIGH (ref 26.0–34.0)
MCHC: 35.4 g/dL (ref 30.0–36.0)
MCV: 96.9 fL (ref 78.0–100.0)
Platelets: 154 10*3/uL (ref 150–400)
RBC: 3.93 MIL/uL (ref 3.87–5.11)
RDW: 12.5 % (ref 11.5–15.5)
WBC: 7.9 10*3/uL (ref 4.0–10.5)

## 2013-11-23 LAB — GLUCOSE, CAPILLARY
Glucose-Capillary: 80 mg/dL (ref 70–99)
Glucose-Capillary: 98 mg/dL (ref 70–99)

## 2013-11-23 LAB — TYPE AND SCREEN
ABO/RH(D): O POS
Antibody Screen: NEGATIVE

## 2013-11-23 LAB — RPR

## 2013-11-23 MED ORDER — OXYTOCIN BOLUS FROM INFUSION
500.0000 mL | INTRAVENOUS | Status: DC
  Administered 2013-11-23: 500 mL via INTRAVENOUS

## 2013-11-23 MED ORDER — OXYCODONE-ACETAMINOPHEN 5-325 MG PO TABS
1.0000 | ORAL_TABLET | ORAL | Status: DC | PRN
Start: 2013-11-23 — End: 2013-11-25

## 2013-11-23 MED ORDER — ONDANSETRON HCL 4 MG/2ML IJ SOLN
4.0000 mg | INTRAMUSCULAR | Status: DC | PRN
Start: 2013-11-23 — End: 2013-11-25

## 2013-11-23 MED ORDER — OXYCODONE-ACETAMINOPHEN 5-325 MG PO TABS: 1.0000 | ORAL_TABLET | ORAL | Status: DC | PRN

## 2013-11-23 MED ORDER — BENZOCAINE-MENTHOL 20-0.5 % EX AERO: 1.0000 "application " | INHALATION_SPRAY | CUTANEOUS | Status: DC | PRN

## 2013-11-23 MED ORDER — TERBUTALINE SULFATE 1 MG/ML IJ SOLN: 0.2500 mg | Freq: Once | INTRAMUSCULAR | Status: DC | PRN

## 2013-11-23 MED ORDER — OXYCODONE-ACETAMINOPHEN 5-325 MG PO TABS: 2.0000 | ORAL_TABLET | ORAL | Status: DC | PRN

## 2013-11-23 MED ORDER — SIMETHICONE 80 MG PO CHEW: 80.0000 mg | CHEWABLE_TABLET | ORAL | Status: DC | PRN

## 2013-11-23 MED ORDER — ONDANSETRON HCL 4 MG/2ML IJ SOLN: 4.0000 mg | Freq: Four times a day (QID) | INTRAMUSCULAR | Status: DC | PRN

## 2013-11-23 MED ORDER — OXYTOCIN 40 UNITS IN LACTATED RINGERS INFUSION - SIMPLE MED: 62.5000 mL/h | INTRAVENOUS | Status: DC

## 2013-11-23 MED ORDER — LANOLIN HYDROUS EX OINT: TOPICAL_OINTMENT | CUTANEOUS | Status: DC | PRN

## 2013-11-23 MED ORDER — FENTANYL CITRATE 0.05 MG/ML IJ SOLN
INTRAMUSCULAR | Status: AC
  Administered 2013-11-23: 100 ug
  Filled 2013-11-23: qty 2

## 2013-11-23 MED ORDER — FLEET ENEMA 7-19 GM/118ML RE ENEM: 1.0000 | ENEMA | RECTAL | Status: DC | PRN

## 2013-11-23 MED ORDER — CITRIC ACID-SODIUM CITRATE 334-500 MG/5ML PO SOLN: 30.0000 mL | ORAL | Status: DC | PRN

## 2013-11-23 MED ORDER — LIDOCAINE HCL (PF) 1 % IJ SOLN
30.0000 mL | INTRAMUSCULAR | Status: DC | PRN
  Filled 2013-11-23: qty 30

## 2013-11-23 MED ORDER — TETANUS-DIPHTH-ACELL PERTUSSIS 5-2.5-18.5 LF-MCG/0.5 IM SUSP: 0.5000 mL | Freq: Once | INTRAMUSCULAR | Status: DC

## 2013-11-23 MED ORDER — ONDANSETRON HCL 4 MG PO TABS: 4.0000 mg | ORAL_TABLET | ORAL | Status: DC | PRN

## 2013-11-23 MED ORDER — WITCH HAZEL-GLYCERIN EX PADS
1.0000 | MEDICATED_PAD | CUTANEOUS | Status: DC | PRN
Start: 2013-11-23 — End: 2013-11-25

## 2013-11-23 MED ORDER — ACETAMINOPHEN 325 MG PO TABS: 650.0000 mg | ORAL_TABLET | ORAL | Status: DC | PRN

## 2013-11-23 MED ORDER — LACTATED RINGERS IV SOLN: 500.0000 mL | INTRAVENOUS | Status: DC | PRN

## 2013-11-23 MED ORDER — DIBUCAINE 1 % RE OINT
1.0000 | TOPICAL_OINTMENT | RECTAL | Status: DC | PRN
Start: 2013-11-23 — End: 2013-11-25

## 2013-11-23 MED ORDER — OXYTOCIN 40 UNITS IN LACTATED RINGERS INFUSION - SIMPLE MED
1.0000 m[IU]/min | INTRAVENOUS | Status: DC
  Administered 2013-11-23: 2 m[IU]/min via INTRAVENOUS
  Filled 2013-11-23: qty 1000

## 2013-11-23 MED ORDER — LACTATED RINGERS IV SOLN
INTRAVENOUS | Status: DC
Start: 2013-11-23 — End: 2013-11-23
  Administered 2013-11-23 (×2): via INTRAVENOUS

## 2013-11-23 MED ORDER — ZOLPIDEM TARTRATE 5 MG PO TABS: 5.0000 mg | ORAL_TABLET | Freq: Every evening | ORAL | Status: DC | PRN

## 2013-11-23 MED ORDER — SENNOSIDES-DOCUSATE SODIUM 8.6-50 MG PO TABS
2.0000 | ORAL_TABLET | ORAL | Status: DC
  Administered 2013-11-23 – 2013-11-25 (×2): 2 via ORAL
  Filled 2013-11-23 (×2): qty 2

## 2013-11-23 MED ORDER — DIPHENHYDRAMINE HCL 25 MG PO CAPS: 25.0000 mg | ORAL_CAPSULE | Freq: Four times a day (QID) | ORAL | Status: DC | PRN

## 2013-11-23 MED ORDER — PRENATAL MULTIVITAMIN CH
1.0000 | ORAL_TABLET | Freq: Every day | ORAL | Status: DC
  Administered 2013-11-24 – 2013-11-25 (×2): 1 via ORAL
  Filled 2013-11-23 (×2): qty 1

## 2013-11-23 MED ORDER — FENTANYL CITRATE 0.05 MG/ML IJ SOLN: 100.0000 ug | INTRAMUSCULAR | Status: DC | PRN

## 2013-11-23 MED ORDER — IBUPROFEN 600 MG PO TABS
600.0000 mg | ORAL_TABLET | Freq: Four times a day (QID) | ORAL | Status: DC
  Administered 2013-11-23 – 2013-11-25 (×7): 600 mg via ORAL
  Filled 2013-11-23 (×7): qty 1

## 2013-11-23 NOTE — H&P (Signed)
Melissa [name] is a 33 y.o. female [phone] @ 38.3wks by 18wk U/S presenting for IOL due to nonreactive tracing and BPP 6/8 (absence of sustained breathing mvmts). Denies leaking or bldg. Her preg has been followed by the [facility] and has been remarkable for 1) GDM A/B- diet controlled with reasonable CBGs during preg 2) EFW today 7+3 Maternal Medical History:  Reason for admission: Contractions.   Contractions: Frequency: regular.   Perceived severity is moderate.   Q67min   Fetal activity: Perceived fetal activity is normal.   Last perceived fetal movement was within the past hour.    Prenatal complications: No bleeding, PIH, pre-eclampsia or preterm labor.   Prenatal Complications - Diabetes: type 2. Diabetes is managed by diet.      Clinic [facility]  Dating Dating is by 18 week 6 day [place]  Genetic Screen    Quad:     negative               Anatomic [place] nml anatomy with limited views of the heart.  Has f/u US for heart.  GTT Early:  A/B diabetic  TDaP vaccine 09/17/13  Flu vaccine 09/17/13  GBS Negative  Contraception Nexplanon  Baby Food breast  Circumcision girl  [place] for children  Support Person Husband--estaban     OB History    Gravida Para Term Preterm AB TAB SAB Ectopic Multiple Living   4 3 3  0 0 0 0 0 0 3     Past Medical History  Diagnosis Date  . No pertinent past medical history   . Medical history non-contributory   . Diabetes mellitus without complication    Past Surgical History  Procedure Laterality Date  . No past surgeries     Family History: family history includes Diabetes in her father. There is no history of Other. Social History:  reports that she has never smoked. She has never used smokeless tobacco. She reports that she does not drink alcohol or use illicit drugs.   Prenatal Transfer Tool  Maternal Diabetes: Yes:  Diabetes Type:  Pre-pregnancy, Diet controlled Genetic Screening: Normal Maternal Ultrasounds/Referrals:  Normal Fetal Ultrasounds or other Referrals:  None Maternal Substance Abuse:  No Significant Maternal Medications:  None Significant Maternal Lab Results:  Lab values include: Group B Strep negative Other Comments:  None  Review of Systems  Constitutional: Negative for fever and chills.  Eyes: Negative for blurred vision and double vision.  Respiratory: Negative for cough.   Cardiovascular: Negative for chest pain and palpitations.    Dilation: 3 Effacement (%): 60, 70 Station: -2 Exam by:: Montey Hora, RN Blood pressure 115/67, pulse 56, temperature 98.4 F (36.9 C), temperature source Oral, resp. rate 18, height 4\' 11"  (1.499 m), weight 66.225 kg (146 lb), last menstrual period 02/16/2013, unknown if currently breastfeeding. Maternal Exam:  Uterine Assessment: Contraction strength is moderate.  Contraction frequency is regular.      Physical Exam  Constitutional: She is oriented to person, place, and time. She appears well-developed and well-nourished.  HENT:  Head: Normocephalic and atraumatic.  Respiratory: Effort normal.  GI:  EFM 130s, +accels, no decels Ctx mild q 3-5 mins  Musculoskeletal: Normal range of motion.  Neurological: She is alert and oriented to person, place, and time.  Skin: Skin is warm and dry.  Psychiatric: She has a normal mood and affect. Her behavior is normal. Thought content normal.    CBGs: 80  Prenatal labs: ABO, Rh: O/Positive/-- (05/18 0000) Antibody:  Negative (05/18 0000) Rubella: Immune (05/18 0000) RPR: NON REAC (09/14 0930)  HBsAg: Negative (05/18 0000)  HIV: NONREACTIVE (09/14 0930)  GBS:  Negative  Assessment/Plan: [name] is a 33 yo G4P3003 at 38.3 weeks here for induction of labor due to [facility] 6/8  #Labor: Pitocin for induction  #Pain:No epidural #FWB:Cat 1, accels present and reassuring #ID:GBS Neg #MOF: Breast  #MOC:Nexplanon #Circ: Girl  Delbert Harness 11/23/2013, 1:10 PM   I  have seen and examined this patient and I agree with the above. Serita Grammes CNM 3:08 PM 11/23/2013

## 2013-11-23 NOTE — Progress Notes (Signed)
Yamilett Anastos is a 33 y.o. G4P3003 at [redacted]w[redacted]d   Subjective: Feeling ctx more strongly  Objective: BP 115/71 mmHg  Pulse 67  Temp(Src) 98.2 F (36.8 C) (Oral)  Resp 18  Ht 4\' 11"  (1.499 m)  Wt 66.225 kg (146 lb)  BMI 29.47 kg/m2  LMP 02/16/2013      FHT:  FHR: 140s bpm, variability: moderate,  accelerations:  Present,  decelerations:  Absent UC:   regular, every 2-4 minutes with Pit @ 87mu/min SVE:  cx 4-5/70/-2; AROM for small bld tinged fluid Labs: Lab Results  Component Value Date   WBC 7.9 11/23/2013   HGB 13.5 11/23/2013   HCT 38.1 11/23/2013   MCV 96.9 11/23/2013   PLT 154 11/23/2013    Assessment / Plan: IUP@ 38.3wks IOL process Early active labor  Will continue to observe and keep ctx regular with Pitocin Anticipate SVD  Serita Grammes CNM 11/23/2013, 7:38 PM

## 2013-11-23 NOTE — Progress Notes (Signed)
Category 2 tracing with baseline in 140s.  Moderate variability, acceleration seen, 2 variable decelerations. Pt sent to [place] for BPP and growth.

## 2013-11-23 NOTE — Plan of Care (Signed)
Problem: Phase I Progression Outcomes Goal: Assess per MD/Nurse,Routine-VS,FHR,UC,Head to Toe assess Outcome: Completed/Met Date Met:  11/23/13 Goal: Obtain and review prenatal records Outcome: Completed/Met Date Met:  11/23/13 Goal: Pain controlled with appropriate interventions Outcome: Completed/Met Date Met:  11/23/13 Goal: OOB as tolerated unless otherwise ordered Outcome: Completed/Met Date Met:  11/23/13 Goal: Tolerating diet Outcome: Completed/Met Date Met:  11/23/13 Goal: Adequate progression of labor Outcome: Completed/Met Date Met:  11/23/13 Goal: Medications/IV Fluids N/A Outcome: Completed/Met Date Met:  11/23/13 Goal: Induction meds as ordered Outcome: Completed/Met Date Met:  11/23/13 Goal: Medical plan of care initiated within 2 hrs of admission Outcome: Completed/Met Date Met:  11/23/13

## 2013-11-23 NOTE — Progress Notes (Signed)
Spoke with Marlowe Kays Jones-infection prevention regarding pt history of MRSA. Pt positive for MRSA in 04/2008 and had had 2 negative MRSA PCR tests since. According to C.Jones patient does not need to be on contact precautions.  Will d/c contact precautions order and cancel MRSA PCR test.

## 2013-11-23 NOTE — Progress Notes (Signed)
[place] for growth today.  BPP added to [place] due to NR NST and 2 variables. Report will be called to Dr. Nehemiah Settle.  IOL scheduled 11/24 @ 0700 unless pt is admitted today.

## 2013-11-23 NOTE — Telephone Encounter (Signed)
Preadmission screen  

## 2013-11-24 LAB — GLUCOSE, CAPILLARY: Glucose-Capillary: 76 mg/dL (ref 70–99)

## 2013-11-24 LAB — HIV ANTIBODY (ROUTINE TESTING W REFLEX): HIV: NONREACTIVE

## 2013-11-24 NOTE — Plan of Care (Signed)
Problem: Phase II Progression Outcomes Goal: Pain controlled on oral analgesia Outcome: Completed/Met Date Met:  11/24/13 Goal: Progress activity as tolerated unless otherwise ordered Outcome: Completed/Met Date Met:  11/24/13 Goal: Afebrile, VS remain stable Outcome: Completed/Met Date Met:  11/24/13 Goal: Tolerating diet Outcome: Completed/Met Date Met:  11/24/13 Goal: Other Phase II Outcomes/Goals Outcome: Completed/Met Date Met:  11/24/13

## 2013-11-24 NOTE — Plan of Care (Signed)
Problem: Discharge Progression Outcomes Goal: Barriers To Progression Addressed/Resolved Outcome: Completed/Met Date Met:  11/24/13

## 2013-11-24 NOTE — Progress Notes (Signed)
Post Partum Day 1 Subjective: Doing well. no complaints, up ad lib, voiding, tolerating PO and + flatus  Objective: Blood pressure 90/50, pulse 60, temperature 98.4 F (36.9 C), temperature source Oral, resp. rate 16, height 4\' 11"  (1.499 m), weight 146 lb (66.225 kg), last menstrual period 02/16/2013, SpO2 99 %, unknown if currently breastfeeding.  Physical Exam:  General: alert, cooperative and no distress Lochia: appropriate Uterine Fundus: firm Incision: N/A DVT Evaluation: No evidence of DVT seen on physical exam.   Recent Labs  11/23/13 1225  HGB 13.5  HCT 38.1    Assessment/Plan: Plan for discharge tomorrow  Breast feeding   LOS: 1 day   Jeaninne Lodico SHWON 11/24/2013, 8:12 AM

## 2013-11-24 NOTE — Plan of Care (Signed)
Problem: Phase I Progression Outcomes Goal: Pain controlled with appropriate interventions Outcome: Completed/Met Date Met:  11/24/13 Goal: Voiding adequately Outcome: Completed/Met Date Met:  11/24/13 Goal: OOB as tolerated unless otherwise ordered Outcome: Completed/Met Date Met:  11/24/13 Goal: VS, stable, temp < 100.4 degrees F Outcome: Completed/Met Date Met:  11/24/13 Goal: Initial discharge plan identified Outcome: Completed/Met Date Met:  11/24/13 Goal: Other Phase I Outcomes/Goals Outcome: Completed/Met Date Met:  11/24/13

## 2013-11-24 NOTE — Plan of Care (Signed)
Problem: Discharge Progression Outcomes Goal: Tolerating diet Outcome: Completed/Met Date Met:  16/94/50 Goal: Complications resolved/controlled Outcome: Completed/Met Date Met:  11/24/13 Goal: Pain controlled with appropriate interventions Outcome: Completed/Met Date Met:  11/24/13 Goal: Discharge plan in place and appropriate Outcome: Completed/Met Date Met:  11/24/13

## 2013-11-25 MED ORDER — IBUPROFEN 600 MG PO TABS: 600.0000 mg | ORAL_TABLET | ORAL | Status: DC | PRN

## 2013-11-25 NOTE — Plan of Care (Signed)
Problem: Consults Goal: Postpartum Patient Education (See Patient Education module for education specifics.)  Outcome: Completed/Met Date Met:  11/25/13     

## 2013-11-25 NOTE — Lactation Note (Signed)
This note was copied from the chart of Melissa Erminia Mcnew. Lactation Consultation Note   Interpreter present.  Baby is BF well.  Manual pump given to mom and explained.  Lactation brochure given to mother with phone number circled so that she can call us with any questions. Patient Name: Melissa [name] [id number] Date: 11/25/2013     Maternal Data    Feeding Feeding Type: Breast Fed Length of feed: 20 min  LATCH Score/Interventions Latch: Grasps breast easily, tongue down, lips flanged, rhythmical sucking.  Audible Swallowing: A few with stimulation  Type of Nipple: Everted at rest and after stimulation  Comfort (Breast/Nipple): Soft / non-tender     Hold (Positioning): No assistance needed to correctly position infant at breast.  LATCH Score: 9  Lactation Tools Discussed/Used     Consult Status Consult Status: Complete    Van Clines 11/25/2013, 12:23 PM

## 2013-11-25 NOTE — Progress Notes (Addendum)
Checked on patient and ordered snack. [place]

## 2013-11-25 NOTE — Progress Notes (Signed)
Checked on patient needs.  °Spanish Interpreter - Benita Sanchez °

## 2013-11-25 NOTE — Plan of Care (Signed)
Problem: Consults Goal: Diabetes Guidelines if Diabetic/Glucose > 140 If diabetic or lab glucose is > 140 mg/dl - Initiate Diabetes/Hyperglycemia Guidelines & Document Interventions  Outcome: Completed/Met Date Met:  11/25/13     

## 2013-11-25 NOTE — Progress Notes (Signed)
Assisted RN with interpretation of audio screening of baby.   Ordered dinner and breakfast for patient. [place]

## 2013-11-25 NOTE — Progress Notes (Signed)
Discharge instructions reviewed with interpreter assistance.

## 2013-11-25 NOTE — Discharge Instructions (Signed)

## 2013-11-25 NOTE — Plan of Care (Signed)
Problem: Discharge Progression Outcomes Goal: Activity appropriate for discharge plan Outcome: Completed/Met Date Met:  11/25/13 Goal: Afebrile, VS remain stable at discharge Outcome: Completed/Met Date Met:  11/25/13 Goal: Other Discharge Outcomes/Goals Outcome: Not Applicable Date Met:  01/05/70

## 2013-11-26 NOTE — Progress Notes (Signed)
11/13 NST reviewed and reactive 

## 2013-11-26 NOTE — Progress Notes (Signed)
Ur chart review completed.  

## 2013-11-27 ENCOUNTER — Inpatient Hospital Stay (HOSPITAL_COMMUNITY): Admission: RE | Admit: 2013-11-27 | Payer: Self-pay | Source: Ambulatory Visit

## 2013-11-27 NOTE — Addendum Note (Signed)
Addended by: Langston Reusing on: 11/27/2013 03:56 PM   Modules accepted: Orders

## 2013-12-04 ENCOUNTER — Encounter: Payer: Self-pay | Admitting: Obstetrics & Gynecology

## 2013-12-18 NOTE — Discharge Summary (Signed)
Obstetric Discharge Summary Reason for Admission: induction of labor Prenatal Procedures: none Intrapartum Procedures: spontaneous vaginal delivery Postpartum Procedures: none Complications-Operative and Postpartum: none HEMOGLOBIN  Date Value Ref Range Status  11/23/2013 13.5 12.0 - 15.0 g/dL Final  05/21/2013 14.0 g/dL Final  05/21/2013 14.0 g/dL Final  05/21/2013 14.0 g/dL Final   HCT  Date Value Ref Range Status  11/23/2013 38.1 36.0 - 46.0 % Final  05/21/2013 42 % Final  05/21/2013 42 % Final    Hospital Course: Melissa [name] is a 33 y.o. [id number] who presented at [redacted]w[redacted]d for IOL 2/2 nonreactive NST and BPP 6/8.  She progressed well through labor and delivered a healthy female via SVD on 11/20 at 8:20pm.  On PPD#2, doing well without complaints or complications and ready for discharge.  Physical Exam:  General: alert, cooperative and no distress Lochia: appropriate Uterine Fundus: firm Incision: n/a  DVT Evaluation: No evidence of DVT seen on physical exam.  Discharge Diagnoses: Term Pregnancy-delivered  Discharge Information: Date: 12/18/2013 Activity: pelvic rest Diet: routine Medications: PNV and Ibuprofen Condition: stable Instructions: refer to practice specific booklet Discharge to: home Follow-up Information    Follow up with [facility]. Schedule an appointment as soon as possible for a visit in 6 weeks.   Why:  For post-partum visit      Follow up with [facility].   Why:  As needed for emergencies   Contact information:   [address] [id number] [place] [phone]      Newborn Data: Live born female  Birth Weight: 6 lb 9.5 oz (2991 g) APGAR: 9, 9  Home with mother.  Melissa [name] 12/18/2013, 11:31 PM   I have seen this patient and agree with the above resident's note.  LEFTWICH-KIRBY, [place] Certified Nurse-Midwife

## 2014-01-09 ENCOUNTER — Ambulatory Visit: Payer: Self-pay | Admitting: Obstetrics & Gynecology

## 2014-03-07 ENCOUNTER — Encounter: Payer: Self-pay | Admitting: Obstetrics & Gynecology

## 2014-11-14 ENCOUNTER — Encounter: Payer: Self-pay | Admitting: Internal Medicine

## 2014-11-14 ENCOUNTER — Ambulatory Visit (INDEPENDENT_AMBULATORY_CARE_PROVIDER_SITE_OTHER): Payer: Self-pay | Admitting: Internal Medicine

## 2014-11-14 VITALS — BP 120/78 | Ht 59.0 in | Wt 143.0 lb

## 2014-11-14 DIAGNOSIS — L853 Xerosis cutis: Secondary | ICD-10-CM

## 2014-11-14 DIAGNOSIS — K029 Dental caries, unspecified: Secondary | ICD-10-CM

## 2014-11-14 NOTE — Patient Instructions (Signed)
No Zest [place] Dove o Caress Eucerin crema for Eczema relief with oatmeal after showers and in the evening.

## 2014-11-14 NOTE — Progress Notes (Signed)
   Subjective:    Patient ID: Melissa [name], female    DOB: 1980-12-28, 34 y.o.   MRN: [id number]  HPI  Itching of skin anywhere on her body.  When she scratches, gets a rash.  Rash isn't red, but skin looks thickened after. States the rash lasts for about 10 minutes.   Started end of June 2016.  Is worried related to Implanon in right arm, which was placed January 18, 2014.    Occurs maybe twice weekly.   When baby is touching her or climbing on her, she seems to get it more.  Notes she is warmer when baby is wanting to be next to her.  Did notice when it was warmer outside, she would itch more then.   Feels her skin may be drier than usual.  When doesn't put lotion on, has more of a problem.  Uses after shower.  Uses Aveeno. Uses Zest soap.  States had problems in past with other soaps making her itchy when pregnant.  Has never tried [place]. Uses Tide for clothes detergent and Downy dryer cloths.  Has not changed these recently. No history of specific food intake prior to itching.   2  Left lower dental pain:  Would like a dental referral.     Review of Systems     Objective:   Physical Exam Skin without abnormality HEENT:  Large cavity at back of right lower molar. Lungs:  CTA CV:  RRR without murmur or rub       Assessment & Plan:  1.  Probably dry skin:  Stop  Zest.  Start Dove or Caress.  Eucerin Cream twice daily, especially after bathing. Avoid getting hot.  2.  Dental cavity:  Dental referral.

## 2015-04-03 ENCOUNTER — Encounter: Payer: Self-pay | Admitting: Internal Medicine

## 2015-04-03 ENCOUNTER — Ambulatory Visit (INDEPENDENT_AMBULATORY_CARE_PROVIDER_SITE_OTHER): Payer: Self-pay | Admitting: Internal Medicine

## 2015-04-03 VITALS — BP 128/82 | HR 78 | Resp 22 | Ht 60.0 in | Wt 148.0 lb

## 2015-04-03 DIAGNOSIS — H11422 Conjunctival edema, left eye: Secondary | ICD-10-CM

## 2015-04-03 DIAGNOSIS — G5601 Carpal tunnel syndrome, right upper limb: Secondary | ICD-10-CM

## 2015-04-03 MED ORDER — ERYTHROMYCIN 5 MG/GM OP OINT: 1.0000 "application " | TOPICAL_OINTMENT | Freq: Three times a day (TID) | OPHTHALMIC | Status: DC

## 2015-04-03 NOTE — Patient Instructions (Signed)
Follow up with Dr. Wyatt Portela tomorrow for your left eye.

## 2015-04-03 NOTE — Progress Notes (Signed)
   Subjective:    Patient ID: Melissa [name], female    DOB: 30-Jan-1980, 35 y.o.   MRN: [id number]  HPI  1.  Chlorox in left eye:  The bottle cap was not secured as she pulled the bottle up and about a teaspoon of the chlorox splashed in her left eye.  Rinsed with water for over 1 hour.  Eye swelled shut until yesterday.  Now with pain and itching of eye.  Has yellow crusting in the morning.  Can see normally out of the eye.    2.  Right hand with numbness and tingling when she is using hand and when trying to sleep at night.  Has had before when pregnant.  Meds:  None.  Allegies:  NKDA    Review of Systems     Objective:   Physical Exam NAD  HEENT:  PERRL, EOMI, RR + OU, Erythema/chemosis of mainly nasal aspect of left eye, including palpabral conjunctiva.  Has bilateral nasal Pterygium--a bit more red over this area, left eye.  Pain with EOMI Yellow crusting involving nasal eyelid margins and on lashes.  Right hand with good grip, + Tinels and Phalens of right wrist, median nerve--symptoms only in middle and ring finger, however.  No atrophy of thenar eminence.        Assessment & Plan:  1.  Chemical injury of left eye.  Appears to be healing, but concerned cold develop scarring of cornea or late injury.  Called Dr. Wyatt Portela.  He recommends Erythromycin ophthal ointment and to he will graciously see her tomorrow to be certain there are no concerns.  Willing to see her for lower cost and work out a Agricultural consultant.  2.  Right Carpal Tunnel Syndrome:  Cock up splint at bedtime daily and Ibuprofen 400-600 mg twice daily with food for 14 days.  Follow up in 2 months

## 2015-06-24 ENCOUNTER — Encounter: Payer: Self-pay | Admitting: Internal Medicine

## 2015-06-24 ENCOUNTER — Ambulatory Visit (INDEPENDENT_AMBULATORY_CARE_PROVIDER_SITE_OTHER): Payer: Self-pay | Admitting: Internal Medicine

## 2015-06-24 VITALS — BP 112/72 | HR 72 | Resp 18 | Ht 59.0 in | Wt 151.0 lb

## 2015-06-24 DIAGNOSIS — M7711 Lateral epicondylitis, right elbow: Secondary | ICD-10-CM

## 2015-06-24 DIAGNOSIS — G5601 Carpal tunnel syndrome, right upper limb: Secondary | ICD-10-CM

## 2015-06-24 MED ORDER — IBUPROFEN 200 MG PO TABS: ORAL_TABLET | ORAL | Status: DC

## 2015-06-24 NOTE — Patient Instructions (Signed)
Keep wearing your cock up splint for your hand and also wear the air cast armband for tennis elbow while you work and are up and about.

## 2015-06-24 NOTE — Progress Notes (Signed)
   Subjective:    Patient ID: Melissa [name], female    DOB: Jun 15, 1980, 35 y.o.   MRN: [id number]  HPI   1.  Pain in right lateral epicondyle area with pain radiating down into right middle and ring fingers.  Last visit end of March, felt this was likely carpal tunnel.  At times has pain up to lateral shoulder as well. Her left hand is starting to bother her also--describes pins and needles on her arm and fingers. Has been wearing right cock up splint at night regularly without improvement.   No neck pain.   Current outpatient prescriptions:  .  erythromycin ophthalmic ointment, Place 1 application into the left eye 3 (three) times daily. (Patient not taking: Reported on 06/24/2015), Disp: 3.5 g, Rfl: 0 .  Prenatal Vit-Fe Fumarate-FA (MULTIVITAMIN-PRENATAL) 27-0.8 MG TABS, Take 1 tablet by mouth daily. Reported on 06/24/2015, Disp: , Rfl:    No Known Allergies    Review of Systems     Objective:   Physical Exam  NAD Mild tenderness over cervical spinous processes and bilateral traps + tinels and Phalens at right volar wrist.  Good grip strength bilaterally. Most tender over right radial forearm tendons as insert on lateral epicondyle.  Worse with alternating supination/pronation movement.        Assessment & Plan:  1.  Right lateral epicondylitis:  To take ibuprofen twice, not once daily with meals.   Obtain air cast arm arm band or the like--copied picture of this and gave to patient.  Wear most of the time--off at night  2.  Carpal tunnel syndrome, right:  To continue cock up splint at night.

## 2015-07-18 ENCOUNTER — Emergency Department (HOSPITAL_COMMUNITY): Payer: Medicaid Other

## 2015-07-18 ENCOUNTER — Encounter (HOSPITAL_COMMUNITY): Payer: Self-pay | Admitting: *Deleted

## 2015-07-18 ENCOUNTER — Inpatient Hospital Stay (HOSPITAL_COMMUNITY)
Admission: EM | Admit: 2015-07-18 | Discharge: 2015-07-21 | DRG: 392 | Disposition: A | Discharge status: Home / Self Care | Payer: Medicaid Other | Attending: Family Medicine | Admitting: Family Medicine

## 2015-07-18 DIAGNOSIS — I491 Atrial premature depolarization: Secondary | ICD-10-CM | POA: Diagnosis not present

## 2015-07-18 DIAGNOSIS — E876 Hypokalemia: Secondary | ICD-10-CM | POA: Diagnosis not present

## 2015-07-18 DIAGNOSIS — K529 Noninfective gastroenteritis and colitis, unspecified: Secondary | ICD-10-CM | POA: Diagnosis not present

## 2015-07-18 HISTORY — DX: Noninfective gastroenteritis and colitis, unspecified: K52.9

## 2015-07-18 LAB — CBC
HCT: 40 % (ref 36.0–46.0)
Hemoglobin: 13.9 g/dL (ref 12.0–15.0)
MCH: 31.4 pg (ref 26.0–34.0)
MCHC: 34.8 g/dL (ref 30.0–36.0)
MCV: 90.5 fL (ref 78.0–100.0)
Platelets: 228 10*3/uL (ref 150–400)
RBC: 4.42 MIL/uL (ref 3.87–5.11)
RDW: 11.6 % (ref 11.5–15.5)
WBC: 13.6 10*3/uL — ABNORMAL HIGH (ref 4.0–10.5)

## 2015-07-18 LAB — URINALYSIS, ROUTINE W REFLEX MICROSCOPIC
Bilirubin Urine: NEGATIVE
Glucose, UA: NEGATIVE mg/dL
HGB URINE DIPSTICK: NEGATIVE
Ketones, ur: NEGATIVE mg/dL
Leukocytes, UA: NEGATIVE
Nitrite: NEGATIVE
PH: 8 (ref 5.0–8.0)
Protein, ur: NEGATIVE mg/dL
SPECIFIC GRAVITY, URINE: 1.019 (ref 1.005–1.030)

## 2015-07-18 LAB — COMPREHENSIVE METABOLIC PANEL
ALBUMIN: 4 g/dL (ref 3.5–5.0)
ALT: 29 U/L (ref 14–54)
AST: 25 U/L (ref 15–41)
Alkaline Phosphatase: 83 U/L (ref 38–126)
Anion gap: 8 (ref 5–15)
BUN: 14 mg/dL (ref 6–20)
CO2: 26 mmol/L (ref 22–32)
CREATININE: 0.7 mg/dL (ref 0.44–1.00)
Calcium: 9.3 mg/dL (ref 8.9–10.3)
Chloride: 104 mmol/L (ref 101–111)
GFR calc non Af Amer: >60 mL/min (ref >60)
GLUCOSE: 125 mg/dL — AB (ref 65–99)
Potassium: 3.7 mmol/L (ref 3.5–5.1)
SODIUM: 138 mmol/L (ref 135–145)
Total Bilirubin: 0.5 mg/dL (ref 0.3–1.2)
Total Protein: 7.2 g/dL (ref 6.5–8.1)

## 2015-07-18 LAB — LIPASE, BLOOD: Lipase: 41 U/L (ref 11–51)

## 2015-07-18 LAB — POC URINE PREG, ED: Preg Test, Ur: NEGATIVE

## 2015-07-18 MED ORDER — HYDROMORPHONE HCL 1 MG/ML IJ SOLN
1.0000 mg | Freq: Once | INTRAMUSCULAR | Status: AC
  Administered 2015-07-18: 1 mg via INTRAVENOUS
  Filled 2015-07-18: qty 1

## 2015-07-18 MED ORDER — POLYETHYLENE GLYCOL 3350 17 G PO PACK: 17.0000 g | PACK | Freq: Every day | ORAL | Status: DC | PRN

## 2015-07-18 MED ORDER — CIPROFLOXACIN IN D5W 400 MG/200ML IV SOLN
400.0000 mg | Freq: Two times a day (BID) | INTRAVENOUS | Status: DC
  Administered 2015-07-18 – 2015-07-21 (×6): 400 mg via INTRAVENOUS
  Filled 2015-07-18 (×7): qty 200

## 2015-07-18 MED ORDER — METRONIDAZOLE IN NACL 5-0.79 MG/ML-% IV SOLN
500.0000 mg | Freq: Once | INTRAVENOUS | Status: AC
  Administered 2015-07-18: 500 mg via INTRAVENOUS
  Filled 2015-07-18: qty 100

## 2015-07-18 MED ORDER — ONDANSETRON 4 MG PO TBDP
ORAL_TABLET | ORAL | Status: AC
  Filled 2015-07-18: qty 1

## 2015-07-18 MED ORDER — ONDANSETRON HCL 4 MG PO TABS
4.0000 mg | ORAL_TABLET | Freq: Once | ORAL | Status: AC
  Administered 2015-07-18: 4 mg via ORAL

## 2015-07-18 MED ORDER — HYDROMORPHONE HCL 1 MG/ML IJ SOLN
1.0000 mg | INTRAMUSCULAR | Status: DC | PRN
  Administered 2015-07-18 – 2015-07-20 (×3): 1 mg via INTRAVENOUS
  Filled 2015-07-18 (×5): qty 1

## 2015-07-18 MED ORDER — CIPROFLOXACIN IN D5W 400 MG/200ML IV SOLN
400.0000 mg | Freq: Once | INTRAVENOUS | Status: AC
  Administered 2015-07-18: 400 mg via INTRAVENOUS
  Filled 2015-07-18: qty 200

## 2015-07-18 MED ORDER — ONDANSETRON HCL 4 MG/2ML IJ SOLN
4.0000 mg | Freq: Once | INTRAMUSCULAR | Status: AC
  Administered 2015-07-18: 4 mg via INTRAVENOUS
  Filled 2015-07-18: qty 2

## 2015-07-18 MED ORDER — ONDANSETRON HCL 4 MG PO TABS: 4.0000 mg | ORAL_TABLET | Freq: Four times a day (QID) | ORAL | Status: DC | PRN

## 2015-07-18 MED ORDER — ONDANSETRON HCL 4 MG/2ML IJ SOLN
4.0000 mg | Freq: Four times a day (QID) | INTRAMUSCULAR | Status: DC | PRN
  Administered 2015-07-18 – 2015-07-20 (×2): 4 mg via INTRAVENOUS
  Filled 2015-07-18 (×3): qty 2

## 2015-07-18 MED ORDER — SODIUM CHLORIDE 0.9 % IV SOLN
INTRAVENOUS | Status: DC
  Administered 2015-07-18 (×3): via INTRAVENOUS

## 2015-07-18 MED ORDER — BISACODYL 5 MG PO TBEC: 5.0000 mg | DELAYED_RELEASE_TABLET | Freq: Every day | ORAL | Status: DC | PRN

## 2015-07-18 MED ORDER — SODIUM CHLORIDE 0.9 % IV BOLUS (SEPSIS)
1000.0000 mL | Freq: Once | INTRAVENOUS | Status: AC
  Administered 2015-07-18: 1000 mL via INTRAVENOUS

## 2015-07-18 MED ORDER — ACETAMINOPHEN 325 MG PO TABS
650.0000 mg | ORAL_TABLET | Freq: Four times a day (QID) | ORAL | Status: DC | PRN
  Administered 2015-07-18 – 2015-07-20 (×4): 650 mg via ORAL
  Filled 2015-07-18 (×4): qty 2

## 2015-07-18 MED ORDER — METRONIDAZOLE IN NACL 5-0.79 MG/ML-% IV SOLN
500.0000 mg | Freq: Four times a day (QID) | INTRAVENOUS | Status: DC
  Administered 2015-07-18 – 2015-07-21 (×12): 500 mg via INTRAVENOUS
  Filled 2015-07-18 (×15): qty 100

## 2015-07-18 MED ORDER — HEPARIN SODIUM (PORCINE) 5000 UNIT/ML IJ SOLN
5000.0000 [IU] | Freq: Three times a day (TID) | INTRAMUSCULAR | Status: DC
  Administered 2015-07-18 – 2015-07-21 (×9): 5000 [IU] via SUBCUTANEOUS
  Filled 2015-07-18 (×9): qty 1

## 2015-07-18 MED ORDER — IOPAMIDOL (ISOVUE-300) INJECTION 61%
INTRAVENOUS | Status: AC
  Administered 2015-07-18: 100 mL
  Filled 2015-07-18: qty 100

## 2015-07-18 MED ORDER — TRAZODONE HCL 50 MG PO TABS
25.0000 mg | ORAL_TABLET | Freq: Every evening | ORAL | Status: DC | PRN
  Filled 2015-07-18: qty 1

## 2015-07-18 MED ORDER — ACETAMINOPHEN 650 MG RE SUPP: 650.0000 mg | Freq: Four times a day (QID) | RECTAL | Status: DC | PRN

## 2015-07-18 MED ORDER — SODIUM CHLORIDE 0.9% FLUSH
3.0000 mL | Freq: Two times a day (BID) | INTRAVENOUS | Status: DC
  Administered 2015-07-20 – 2015-07-21 (×2): 3 mL via INTRAVENOUS

## 2015-07-18 NOTE — Consult Note (Signed)
Reason for Consult:Colitis Referring Physician: Adalida Garver is an 35 y.o. female.  HPI: Melissa [name] developed right sided abdominal pain with associated nausea and vomiting yesterday. She came to the ED overnight where workup revealed mild leukocytosis of 13,600. CT scan abdomen and pelvis demonstrated colitis involving the cecum and right colon with a couple foci of tiny amounts of extraluminal gas. She is being admitted to the medical service for treatment of colitis and we are asked to consult from a surgical standpoint. At this time, she claims her pain has resolved and her nausea has resolved both after receiving some medication in the emergency department. She denies any current medical problems, has no surgical history, and she works at [employer].  Past Medical History  Diagnosis Date  . DM (diabetes mellitus), gestational, antepartum 2015    Past Surgical History  Procedure Laterality Date  . No past surgeries      Family History  Problem Relation Age of Onset  . Other Neg Hx   . Diabetes Father   . Hypertension Father   . Hypertension Sister     Social History:  reports that she has never smoked. She has never used smokeless tobacco. She reports that she does not drink alcohol or use illicit drugs.  Allergies: No Known Allergies  Medications:  Scheduled: . ondansetron       Continuous:  PRN:  Results for orders placed or performed during the hospital encounter of 07/18/15 (from the past 48 hour(s))  Urinalysis, Routine w reflex microscopic     Status: None   Collection Time: 07/18/15  1:45 AM  Result Value Ref Range   Color, Urine YELLOW YELLOW   APPearance CLEAR CLEAR   Specific Gravity, Urine 1.019 1.005 - 1.030   pH 8.0 5.0 - 8.0   Glucose, UA NEGATIVE NEGATIVE mg/dL   Hgb urine dipstick NEGATIVE NEGATIVE   Bilirubin Urine NEGATIVE NEGATIVE   Ketones, ur NEGATIVE NEGATIVE mg/dL   Protein, ur NEGATIVE NEGATIVE mg/dL   Nitrite NEGATIVE NEGATIVE    Leukocytes, UA NEGATIVE NEGATIVE    Comment: MICROSCOPIC NOT DONE ON URINES WITH NEGATIVE PROTEIN, BLOOD, LEUKOCYTES, NITRITE, OR GLUCOSE <1000 mg/dL.  Lipase, blood     Status: None   Collection Time: 07/18/15  1:48 AM  Result Value Ref Range   Lipase 41 11 - 51 U/L  Comprehensive metabolic panel     Status: Abnormal   Collection Time: 07/18/15  1:48 AM  Result Value Ref Range   Sodium 138 135 - 145 mmol/L   Potassium 3.7 3.5 - 5.1 mmol/L   Chloride 104 101 - 111 mmol/L   CO2 26 22 - 32 mmol/L   Glucose, Bld 125 (H) 65 - 99 mg/dL   BUN 14 6 - 20 mg/dL   Creatinine, Ser 0.70 0.44 - 1.00 mg/dL   Calcium 9.3 8.9 - 10.3 mg/dL   Total Protein 7.2 6.5 - 8.1 g/dL   Albumin 4.0 3.5 - 5.0 g/dL   AST 25 15 - 41 U/L   ALT 29 14 - 54 U/L   Alkaline Phosphatase 83 38 - 126 U/L   Total Bilirubin 0.5 0.3 - 1.2 mg/dL   GFR calc non Af Amer >60 >60 mL/min   GFR calc Af Amer >60 >60 mL/min    Comment: (NOTE) The eGFR has been calculated using the CKD EPI equation. This calculation has not been validated in all clinical situations. eGFR's persistently <60 mL/min signify possible Chronic Kidney Disease.  Anion gap 8 5 - 15  CBC     Status: Abnormal   Collection Time: 07/18/15  1:48 AM  Result Value Ref Range   WBC 13.6 (H) 4.0 - 10.5 K/uL   RBC 4.42 3.87 - 5.11 MIL/uL   Hemoglobin 13.9 12.0 - 15.0 g/dL   HCT 40.0 36.0 - 46.0 %   MCV 90.5 78.0 - 100.0 fL   MCH 31.4 26.0 - 34.0 pg   MCHC 34.8 30.0 - 36.0 g/dL   RDW 11.6 11.5 - 15.5 %   Platelets 228 150 - 400 K/uL  POC urine preg, ED     Status: None   Collection Time: 07/18/15  2:01 AM  Result Value Ref Range   Preg Test, Ur NEGATIVE NEGATIVE    Comment:        THE SENSITIVITY OF THIS METHODOLOGY IS >24 mIU/mL     Ct Abdomen Pelvis W Contrast  07/18/2015  CLINICAL DATA:  Initial evaluation for acute abdominal pain. EXAM: CT ABDOMEN AND PELVIS WITH CONTRAST TECHNIQUE: Multidetector CT imaging of the abdomen and pelvis was  performed using the standard protocol following bolus administration of intravenous contrast. CONTRAST:  145m ISOVUE-300 IOPAMIDOL (ISOVUE-300) INJECTION 61% COMPARISON:  None. FINDINGS: Mild scattered atelectatic changes present within the visualized lung bases. Few small calcified granulomas noted. Partially visualized lungs are otherwise clear. Liver demonstrates a normal contrast enhanced appearance. Gallbladder within normal limits. No biliary dilatation. Spleen, adrenal glands, and pancreas demonstrate a normal contrast enhanced appearance. Kidneys are equal in size with symmetric enhancement. No nephrolithiasis, hydronephrosis, or focal enhancing renal mass. Stomach within normal limits. No evidence for bowel obstruction. Focal inflammatory stranding present about the cecum/ascending colon within the right abdomen (series 2, image 45). There are a few small foci of gas within is area of inflammation at appear to be extraluminal, worrisome for associated micro perforation (series 6, image 48). Few scattered sub cm reactive mesenteric lymph nodes present. Findings suggestive of acute colitis. The adjacent terminal ileum is relatively normal in appearance. Similarly, the appendix is visualized just inferiorly within the right lower quadrant and is of normal caliber without associated inflammatory changes to suggest acute appendicitis. No other significant inflammatory changes about the colon. Mild circumferential bladder wall thickening likely related incomplete distension. Bladder are otherwise unremarkable. Uterus and ovaries within normal limits. No free intraperitoneal air. No free fluid. Again, scattered reactive mesenteric lymph nodes clustered within the right lower quadrant, largest of which measures 8 mm. No pathologically enlarged lymph nodes identified within abdomen and pelvis. Normal intravascular enhancement seen throughout the intra-abdominal aorta and its branch vessels. No acute osseous  abnormality. No worrisome lytic or blastic osseous lesions. IMPRESSION: 1. Inflammatory stranding about the cecum/ascending colon, consistent with acute colitis. Few scattered foci of extraluminal gas within this region concerning for associated micro perforation. No abscess or drainable fluid collection. 2. No other acute intra-abdominal process identified. Critical Value/emergent results were called by telephone at the time of interpretation on 07/18/2015 at 5:18 am to Dr. CJoseph Berkshire, who verbally acknowledged these results. Electronically Signed   By: BJeannine BogaM.D.   On: 07/18/2015 05:22    Review of Systems  Constitutional: Positive for malaise/fatigue. Negative for fever.  Eyes: Negative for blurred vision.  Respiratory: Negative for cough, hemoptysis and shortness of breath.   Cardiovascular: Negative for chest pain.  Gastrointestinal: Positive for nausea, vomiting and abdominal pain. Negative for diarrhea.  Genitourinary: Negative.   Musculoskeletal: Negative.   Skin: Negative.  Neurological: Negative.  Negative for headaches.  Endo/Heme/Allergies: Negative.   Psychiatric/Behavioral: Negative.    Blood pressure 109/80, pulse 77, temperature 98 F (36.7 C), temperature source Oral, resp. rate 18, SpO2 100 %, currently breastfeeding. Physical Exam  Constitutional: She is oriented to person, place, and time. She appears well-developed and well-nourished. No distress.  HENT:  Head: Normocephalic and atraumatic.  Right Ear: External ear normal.  Left Ear: External ear normal.  Nose: Nose normal.  Mouth/Throat: Oropharynx is clear and moist.  Eyes: EOM are normal. Pupils are equal, round, and reactive to light.  Neck: Neck supple. No tracheal deviation present.  Cardiovascular: Normal rate, regular rhythm, normal heart sounds and intact distal pulses.   Respiratory: Effort normal and breath sounds normal. No stridor. No respiratory distress. She has no wheezes. She  has no rales.  GI: Soft. She exhibits no distension. There is tenderness. There is no rebound and no guarding.  Minimal tenderness right abdomen to deep palpation, no masses, no other tenderness, no guarding, no peritoneal signs, bowel sounds are present  Musculoskeletal: Normal range of motion. She exhibits no edema or tenderness.  Neurological: She is alert and oriented to person, place, and time. She exhibits normal muscle tone.  Skin: Skin is warm and dry.  Psychiatric: She has a normal mood and affect. Thought content normal.    Assessment/Plan: Colitis involving the cecum and right colon - agree with medical admission, bowel rest, and IV antibiotics. No need for emergent surgery. Would allow ice chips and sips of water today. We will follow closely and likely be able to advance her diet tomorrow if she continues to do well. I discussed the plan of care with her in detail and answered her questions.  Exavier Lina E 07/18/2015, 8:01 AM

## 2015-07-18 NOTE — ED Notes (Signed)
Dr. Thompson at bedside to assess patient.  

## 2015-07-18 NOTE — H&P (Signed)
History and Physical    Melissa [name] [id number] DOB: November 17, 1980 DOA: 07/18/2015  PCP: Mack Hook, MD  Patient coming from:  Home    Chief Complaint:  abdominal pain, nausea and vomiting  HPI: Melissa [name] is a 35 y.o. female, non-English-speaking, with no significant medical history. A Spanish interpreter was used to obtain information for this admission. Patient woke up around 3 AM with mid and upper abdominal pain associated with nausea and vomiting.. Pain intermittent, no aggravating or alleviating factors. No fevers / chills. She has never had this type of pain before. Patient has no history of gastrointestinal problems. She admits to diarrhea last Thursday but otherwise normal BMs. Patient takes no medications at home. No surgical history. Pain much better after IV pain medications.   ED Course:  Afebrile, heart rate currently 120 and irregular, normotensive White blood cell 13 point 6 normal hemoglobin, normal electrolytes in the chemistry profile, renal function is normal Dose of Cipro and Flagyl given Antiemetics and Dilaudid Liter bolus  Preg test negative  Review of Systems: As per HPI, otherwise 10 point review of systems negative.    Past Medical History  Diagnosis Date  . DM (diabetes mellitus), gestational, antepartum 2015    Past Surgical History  Procedure Laterality Date  . No past surgeries      Social History   Social History  . Marital Status: Single    Spouse Name: Esteban(boyfriend)  . Number of Children: 4  . Years of Education: 6    Occupational History  . [employer]    Social History Main Topics  . Smoking status: Never Smoker   . Smokeless tobacco: Never Used  . Alcohol Use: No  . Drug Use: No  . Sexual Activity:    Partners: Male   Other Topics Concern  . Not on file   Social History Narrative   Originally from [place]   Came to [place] In 2003   Lives at home with her 4 children   Long term  boyfriend--7 years.   Lives at home with her children and their father but is unmarried No Known Allergies  Family History  Problem Relation Age of Onset  . Other Neg Hx   . Diabetes Father   . Hypertension Father   . Hypertension Sister     Prior to Admission medications   None  Physical Exam: Filed Vitals:   07/18/15 0730 07/18/15 0745 07/18/15 0808 07/18/15 0813  BP: 110/63 109/80    Pulse:  77  104  Temp:   98.4 F (36.9 C)   TempSrc:   Oral   Resp:      SpO2: 100% 100%  98%    Constitutional:  Pleasant well-developed Hispanic female in NAD, calm, comfortable Filed Vitals:   07/18/15 0730 07/18/15 0745 07/18/15 0808 07/18/15 0813  BP: 110/63 109/80    Pulse:  77  104  Temp:   98.4 F (36.9 C)   TempSrc:   Oral   Resp:      SpO2: 100% 100%  98%   Eyes: PER, lids and conjunctivae normal ENMT: Mucous membranes are moist. Posterior pharynx clear of any exudate or lesions..  Neck: normal, supple, no masses Respiratory: clear to auscultation bilaterally, no wheezing, no crackles. Normal respiratory effort. No accessory muscle use.  Cardiovascular: Tachy in 120s,  Rhythm is irregular, no murmurs / rubs / gallops. No extremity edema. 2+ pedal pulses.   Abdomen: Soft, nondistended, mild mid abdominal tenderness, no masses palpated. Bowel sounds positive.  Musculoskeletal: no clubbing / cyanosis. No joint deformity upper and lower extremities. Good ROM, no contractures. Normal muscle tone.  Skin: no rashes, lesions, ulcers.  Neurologic: CN 2-12 grossly intact. Sensation intact, Strength 5/5 in all 4.  Psychiatric: Normal judgment and insight. Alert and oriented x 3. Normal mood.   Labs on Admission: I have personally reviewed following labs and imaging studies  Urine analysis:    Component Value Date/Time   COLORURINE YELLOW 07/18/2015 0145   APPEARANCEUR CLEAR 07/18/2015 0145   LABSPEC 1.019 07/18/2015 0145   PHURINE 8.0 07/18/2015 0145   GLUCOSEU NEGATIVE  07/18/2015 0145   HGBUR NEGATIVE 07/18/2015 0145   BILIRUBINUR NEGATIVE 07/18/2015 0145   KETONESUR NEGATIVE 07/18/2015 0145   PROTEINUR NEGATIVE 07/18/2015 0145   UROBILINOGEN 0.2 11/19/2013 0747   NITRITE NEGATIVE 07/18/2015 0145   LEUKOCYTESUR NEGATIVE 07/18/2015 0145    Radiological Exams on Admission: Ct Abdomen Pelvis W Contrast  07/18/2015  CLINICAL DATA:  Initial evaluation for acute abdominal pain. EXAM: CT ABDOMEN AND PELVIS WITH CONTRAST TECHNIQUE: Multidetector CT imaging of the abdomen and pelvis was performed using the standard protocol following bolus administration of intravenous contrast. CONTRAST:  151mL ISOVUE-300 IOPAMIDOL (ISOVUE-300) INJECTION 61% COMPARISON:  None. FINDINGS: Mild scattered atelectatic changes present within the visualized lung bases. Few small calcified granulomas noted. Partially visualized lungs are otherwise clear. Liver demonstrates a normal contrast enhanced appearance. Gallbladder within normal limits. No biliary dilatation. Spleen, adrenal glands, and pancreas demonstrate a normal contrast enhanced appearance. Kidneys are equal in size with symmetric enhancement. No nephrolithiasis, hydronephrosis, or focal enhancing renal mass. Stomach within normal limits. No evidence for bowel obstruction. Focal inflammatory stranding present about the cecum/ascending colon within the right abdomen (series 2, image 45). There are a few small foci of gas within is area of inflammation at appear to be extraluminal, worrisome for associated micro perforation (series 6, image 48). Few scattered sub cm reactive mesenteric lymph nodes present. Findings suggestive of acute colitis. The adjacent terminal ileum is relatively normal in appearance. Similarly, the appendix is visualized just inferiorly within the right lower quadrant and is of normal caliber without associated inflammatory changes to suggest acute appendicitis. No other significant inflammatory changes about the  colon. Mild circumferential bladder wall thickening likely related incomplete distension. Bladder are otherwise unremarkable. Uterus and ovaries within normal limits. No free intraperitoneal air. No free fluid. Again, scattered reactive mesenteric lymph nodes clustered within the right lower quadrant, largest of which measures 8 mm. No pathologically enlarged lymph nodes identified within abdomen and pelvis. Normal intravascular enhancement seen throughout the intra-abdominal aorta and its branch vessels. No acute osseous abnormality. No worrisome lytic or blastic osseous lesions. IMPRESSION: 1. Inflammatory stranding about the cecum/ascending colon, consistent with acute colitis. Few scattered foci of extraluminal gas within this region concerning for associated micro perforation. No abscess or drainable fluid collection. 2. No other acute intra-abdominal process identified. Critical Value/emergent results were called by telephone at the time of interpretation on 07/18/2015 at 5:18 am to Dr. Joseph Berkshire , who verbally acknowledged these results. Electronically Signed   By: Jeannine Boga M.D.   On: 07/18/2015 05:22    Assessment/Plan   Principal Problem:   Colitis Active Problems:   PREMATURE ATRIAL CONTRACTIONS      Right sided acute colitis with associated microperforation. Patient non-toxic appearing. She is hemodynamically stable,afebrile with normal WBC.   Had diarrhea last week. Suspect infectious etiology. No diarrhea now to collect stool samples.          -  Admit to Med-Surg -Surgical evaluation - in progress now -IVF, diet per surgery -Continue Cipro / flagyl -analgesics, anti-emetics     Hx of premature atrial contraction.  irregular rhythm. EKG - sinus tachy and PACs. HR 109. On exam.  Currently, HR in 120s, - telemetry for now  DVT prophylaxis:   SQ Heparin  Code Status:   Full code Family Communication:   None .  Disposition Plan:  Discharge home in 2-3 days               Consults called:  General Surgery  Admission status:  Admission- Telemetry    Tye Savoy NP [facility] Pager [phone]  If 7PM-7AM, please contact night-coverage [email or web address] Password [email or web address]  07/18/2015, 8:27 AM

## 2015-07-18 NOTE — ED Notes (Signed)
Pt c/o abd pain with N/V/D since yesterday. States that the N/V/D hasn't lasted but her abd still hurts.

## 2015-07-18 NOTE — ED Notes (Signed)
Melissa Savoy, NP at bedside to assess patient.

## 2015-07-18 NOTE — Progress Notes (Signed)
Pending on admission per Dr. Mariane Baumgarten  34 year old lady with past medical history of diabetes, who presents with nausea, vomiting, diarrhea and abdominal pain. Has leukocytosis with WBC13.6, but no fever. CT abdomen/pelvis that showed acute colitis with microperforation. Hemodynamically stable. Ciprofloxacin and Flagyl started.  Surgeon be consulted by EDP. Patient is accepted to med-surg bed as inpt.  Ivor Costa, MD  [facility] Pager [phone]  If 7PM-7AM, please contact night-coverage [email or web address] Password [facility] 07/18/2015, 6:23 AM

## 2015-07-18 NOTE — ED Provider Notes (Addendum)
CSN: [id number]     Arrival date & time 07/18/15  0136 History   By signing my name below, I, Evelene Croon, attest that this documentation has been prepared under the direction and in the presence of Orpah Greek, MD . Electronically Signed: Evelene Croon, Scribe. 07/18/2015. 3:50 AM.  Chief Complaint  Patient presents with  . Abdominal Pain    The history is provided by the patient. No language interpreter was used.     HPI Comments:  Melissa [name] is a 35 y.o. female who presents to the Emergency Department complaining of 10/10, mid abdominal pain since yesterday with associated nausea and diarrhea. She denies h/o similar pain and h/o abdominal surgeries. No alleviating factors noted.  Past Medical History  Diagnosis Date  . DM (diabetes mellitus), gestational, antepartum 2015   Past Surgical History  Procedure Laterality Date  . No past surgeries     Family History  Problem Relation Age of Onset  . Other Neg Hx   . Diabetes Father   . Hypertension Father   . Hypertension Sister    Social History  Substance Use Topics  . Smoking status: Never Smoker   . Smokeless tobacco: Never Used  . Alcohol Use: No   OB History    Gravida Para Term Preterm AB TAB SAB Ectopic Multiple Living   4 4 4  0 0 0 0 0 0 4     Review of Systems  Constitutional: Negative for fever and chills.  Respiratory: Negative for shortness of breath.   Cardiovascular: Negative for chest pain.  Gastrointestinal: Positive for nausea, abdominal pain and diarrhea.  All other systems reviewed and are negative.   Allergies  Review of patient's allergies indicates no known allergies.  Home Medications   Prior to Admission medications   Medication Sig Start Date End Date Taking? Authorizing Provider  ibuprofen (ADVIL,MOTRIN) 200 MG tablet 3 tabs by mouth twice daily as needed for arm and hand pain Patient not taking: Reported on 07/18/2015 06/24/15   Mack Hook, MD   BP 142/84  mmHg  Pulse 85  Temp(Src) 98 F (36.7 C) (Oral)  Resp 18  SpO2 100% Physical Exam  Constitutional: She is oriented to person, place, and time. She appears well-developed and well-nourished. No distress.  HENT:  Head: Normocephalic and atraumatic.  Right Ear: Hearing normal.  Left Ear: Hearing normal.  Nose: Nose normal.  Mouth/Throat: Oropharynx is clear and moist and mucous membranes are normal.  Eyes: Conjunctivae and EOM are normal. Pupils are equal, round, and reactive to light.  Neck: Normal range of motion. Neck supple.  Cardiovascular: Normal rate, regular rhythm, S1 normal and S2 normal.  Exam reveals no gallop and no friction rub.   No murmur heard. Pulmonary/Chest: Effort normal and breath sounds normal. No respiratory distress. She exhibits no tenderness.  Abdominal: Normal appearance and bowel sounds are normal. She exhibits distension (mild). There is no hepatosplenomegaly. There is tenderness (diffuse). There is no rebound, no guarding, no tenderness at McBurney's point and negative Murphy's sign. No hernia.  Musculoskeletal: Normal range of motion.  Neurological: She is alert and oriented to person, place, and time. She has normal strength. No cranial nerve deficit or sensory deficit. Coordination normal. GCS eye subscore is 4. GCS verbal subscore is 5. GCS motor subscore is 6.  Skin: Skin is warm, dry and intact. No rash noted. No cyanosis.  Psychiatric: She has a normal mood and affect. Her speech is normal and behavior is normal. Thought  content normal.  Nursing note and vitals reviewed.   ED Course  Procedures  DIAGNOSTIC STUDIES:  Oxygen Saturation is 100% on RA, normal by my interpretation.    COORDINATION OF CARE:  3:49 AM Discussed treatment plan with pt at bedside and pt agreed to plan.  Labs Review Labs Reviewed  COMPREHENSIVE METABOLIC PANEL - Abnormal; Notable for the following:    Glucose, Bld 125 (*)    All other components within normal limits   CBC - Abnormal; Notable for the following:    WBC 13.6 (*)    All other components within normal limits  LIPASE, BLOOD  URINALYSIS, ROUTINE W REFLEX MICROSCOPIC (NOT AT [facility])  POC URINE PREG, ED    Imaging Review Ct Abdomen Pelvis W Contrast  07/18/2015  CLINICAL DATA:  Initial evaluation for acute abdominal pain. EXAM: CT ABDOMEN AND PELVIS WITH CONTRAST TECHNIQUE: Multidetector CT imaging of the abdomen and pelvis was performed using the standard protocol following bolus administration of intravenous contrast. CONTRAST:  151mL ISOVUE-300 IOPAMIDOL (ISOVUE-300) INJECTION 61% COMPARISON:  None. FINDINGS: Mild scattered atelectatic changes present within the visualized lung bases. Few small calcified granulomas noted. Partially visualized lungs are otherwise clear. Liver demonstrates a normal contrast enhanced appearance. Gallbladder within normal limits. No biliary dilatation. Spleen, adrenal glands, and pancreas demonstrate a normal contrast enhanced appearance. Kidneys are equal in size with symmetric enhancement. No nephrolithiasis, hydronephrosis, or focal enhancing renal mass. Stomach within normal limits. No evidence for bowel obstruction. Focal inflammatory stranding present about the cecum/ascending colon within the right abdomen (series 2, image 45). There are a few small foci of gas within is area of inflammation at appear to be extraluminal, worrisome for associated micro perforation (series 6, image 48). Few scattered sub cm reactive mesenteric lymph nodes present. Findings suggestive of acute colitis. The adjacent terminal ileum is relatively normal in appearance. Similarly, the appendix is visualized just inferiorly within the right lower quadrant and is of normal caliber without associated inflammatory changes to suggest acute appendicitis. No other significant inflammatory changes about the colon. Mild circumferential bladder wall thickening likely related incomplete distension. Bladder  are otherwise unremarkable. Uterus and ovaries within normal limits. No free intraperitoneal air. No free fluid. Again, scattered reactive mesenteric lymph nodes clustered within the right lower quadrant, largest of which measures 8 mm. No pathologically enlarged lymph nodes identified within abdomen and pelvis. Normal intravascular enhancement seen throughout the intra-abdominal aorta and its branch vessels. No acute osseous abnormality. No worrisome lytic or blastic osseous lesions. IMPRESSION: 1. Inflammatory stranding about the cecum/ascending colon, consistent with acute colitis. Few scattered foci of extraluminal gas within this region concerning for associated micro perforation. No abscess or drainable fluid collection. 2. No other acute intra-abdominal process identified. Critical Value/emergent results were called by telephone at the time of interpretation on 07/18/2015 at 5:18 am to Dr. Joseph Berkshire , who verbally acknowledged these results. Electronically Signed   By: Jeannine Boga M.D.   On: 07/18/2015 05:22   I have personally reviewed and evaluated these images and lab results as part of my medical decision-making.   EKG Interpretation None      MDM   Final diagnoses:  None  Colitis  Patient presents with complaints of abdominal pain. She has been expressing nausea, vomiting and diarrhea. Symptoms began yesterday. She reports that the vomiting and diarrhea has resolved but she has continued to have persistent abdominal pain. Examination revealed mild abdominal distention with diffuse tenderness. She does have bowel sounds present.  Vital signs were normal, no fever. Blood work revealed mild leukocytosis, otherwise no abnormalities. CT scan performed because of the amount of pain she was experiencing. This shows evidence of acute colitis in the cecum and ascending colon. There are small scattered foci of extraluminal gas consistent with microperforation. Patient will require  hospitalization for IV antibiotic therapy.  Discussed briefly with general surgery, Dr. Ninfa Linden. Agrees with antibiotic coverage and general surgery will consult on the patient today.  I personally performed the services described in this documentation, which was scribed in my presence. The recorded information has been reviewed and is accurate.    Orpah Greek, MD 07/18/15 [place], MD 07/18/15 [phone]

## 2015-07-18 NOTE — ED Notes (Signed)
Patient transported to CT 

## 2015-07-19 DIAGNOSIS — K529 Noninfective gastroenteritis and colitis, unspecified: Secondary | ICD-10-CM | POA: Diagnosis not present

## 2015-07-19 DIAGNOSIS — I491 Atrial premature depolarization: Secondary | ICD-10-CM

## 2015-07-19 LAB — COMPREHENSIVE METABOLIC PANEL
ALBUMIN: 3.1 g/dL — AB (ref 3.5–5.0)
ALK PHOS: 113 U/L (ref 38–126)
ALT: 28 U/L (ref 14–54)
AST: 18 U/L (ref 15–41)
Anion gap: 10 (ref 5–15)
BUN: 6 mg/dL (ref 6–20)
CALCIUM: 8.4 mg/dL — AB (ref 8.9–10.3)
CHLORIDE: 107 mmol/L (ref 101–111)
CO2: 22 mmol/L (ref 22–32)
CREATININE: 0.5 mg/dL (ref 0.44–1.00)
GFR calc Af Amer: >60 mL/min (ref >60)
GFR calc non Af Amer: >60 mL/min (ref >60)
GLUCOSE: 76 mg/dL (ref 65–99)
Potassium: 3.4 mmol/L — ABNORMAL LOW (ref 3.5–5.1)
SODIUM: 139 mmol/L (ref 135–145)
Total Bilirubin: 0.5 mg/dL (ref 0.3–1.2)
Total Protein: 6.6 g/dL (ref 6.5–8.1)

## 2015-07-19 LAB — CBC
HCT: 38.5 % (ref 36.0–46.0)
HEMOGLOBIN: 12.7 g/dL (ref 12.0–15.0)
MCH: 30.2 pg (ref 26.0–34.0)
MCHC: 33 g/dL (ref 30.0–36.0)
MCV: 91.7 fL (ref 78.0–100.0)
PLATELETS: 205 10*3/uL (ref 150–400)
RBC: 4.2 MIL/uL (ref 3.87–5.11)
RDW: 11.9 % (ref 11.5–15.5)
WBC: 11.3 10*3/uL — AB (ref 4.0–10.5)

## 2015-07-19 LAB — MRSA PCR SCREENING: MRSA BY PCR: NEGATIVE

## 2015-07-19 MED ORDER — POTASSIUM CHLORIDE 2 MEQ/ML IV SOLN
INTRAVENOUS | Status: DC
  Administered 2015-07-19 – 2015-07-20 (×3): via INTRAVENOUS
  Filled 2015-07-19 (×8): qty 1000

## 2015-07-19 NOTE — Progress Notes (Signed)
Subjective: Feels much better.  Denies pain.  No stool recorded.  Hungry. Afebrile.  WBC down to 11,300.  Potassium 3.4.  BUN 6.  Creatinine 0.5.  Objective: Vital signs in last 24 hours: Temp:  [98.3 F (36.8 C)-98.9 F (37.2 C)] 98.7 F (37.1 C) (07/15 0608) Pulse Rate:  [76-103] 91 (07/15 0608) Resp:  [18-19] 18 (07/14 2111) BP: (95-120)/(53-80) 95/53 mmHg (07/15 0608) SpO2:  [97 %-100 %] 100 % (07/15 0608) Weight:  [68.493 kg (151 lb)] 68.493 kg (151 lb) (07/15 0800) Last BM Date: 07/17/15  Intake/Output from previous day: 07/14 0701 - 07/15 0700 In: 3039.6 [I.V.:2539.6; IV Piggyback:500] Out: 1750 [Urine:1750] Intake/Output this shift:    General appearance: Alert.  Cooperative.  Pleasant.  No distress. Resp: clear to auscultation bilaterally GI: Soft.  Nontender.  No mass.  No distention.  No hernia.  Lab Results:   Recent Labs  07/18/15 0148 07/19/15 0303  WBC 13.6* 11.3*  HGB 13.9 12.7  HCT 40.0 38.5  PLT 228 205   BMET  Recent Labs  07/18/15 0148 07/19/15 0303  NA 138 139  K 3.7 3.4*  CL 104 107  CO2 26 22  GLUCOSE 125* 76  BUN 14 6  CREATININE 0.70 0.50  CALCIUM 9.3 8.4*   PT/INR No results for input(s): LABPROT, INR in the last 72 hours. ABG No results for input(s): PHART, HCO3 in the last 72 hours.  Invalid input(s): PCO2, PO2  Studies/Results: Ct Abdomen Pelvis W Contrast  07/18/2015  CLINICAL DATA:  Initial evaluation for acute abdominal pain. EXAM: CT ABDOMEN AND PELVIS WITH CONTRAST TECHNIQUE: Multidetector CT imaging of the abdomen and pelvis was performed using the standard protocol following bolus administration of intravenous contrast. CONTRAST:  138mL ISOVUE-300 IOPAMIDOL (ISOVUE-300) INJECTION 61% COMPARISON:  None. FINDINGS: Mild scattered atelectatic changes present within the visualized lung bases. Few small calcified granulomas noted. Partially visualized lungs are otherwise clear. Liver demonstrates a normal contrast  enhanced appearance. Gallbladder within normal limits. No biliary dilatation. Spleen, adrenal glands, and pancreas demonstrate a normal contrast enhanced appearance. Kidneys are equal in size with symmetric enhancement. No nephrolithiasis, hydronephrosis, or focal enhancing renal mass. Stomach within normal limits. No evidence for bowel obstruction. Focal inflammatory stranding present about the cecum/ascending colon within the right abdomen (series 2, image 45). There are a few small foci of gas within is area of inflammation at appear to be extraluminal, worrisome for associated micro perforation (series 6, image 48). Few scattered sub cm reactive mesenteric lymph nodes present. Findings suggestive of acute colitis. The adjacent terminal ileum is relatively normal in appearance. Similarly, the appendix is visualized just inferiorly within the right lower quadrant and is of normal caliber without associated inflammatory changes to suggest acute appendicitis. No other significant inflammatory changes about the colon. Mild circumferential bladder wall thickening likely related incomplete distension. Bladder are otherwise unremarkable. Uterus and ovaries within normal limits. No free intraperitoneal air. No free fluid. Again, scattered reactive mesenteric lymph nodes clustered within the right lower quadrant, largest of which measures 8 mm. No pathologically enlarged lymph nodes identified within abdomen and pelvis. Normal intravascular enhancement seen throughout the intra-abdominal aorta and its branch vessels. No acute osseous abnormality. No worrisome lytic or blastic osseous lesions. IMPRESSION: 1. Inflammatory stranding about the cecum/ascending colon, consistent with acute colitis. Few scattered foci of extraluminal gas within this region concerning for associated micro perforation. No abscess or drainable fluid collection. 2. No other acute intra-abdominal process identified. Critical Value/emergent results  were  called by telephone at the time of interpretation on 07/18/2015 at 5:18 am to Dr. Joseph Berkshire , who verbally acknowledged these results. Electronically Signed   By: Jeannine Boga M.D.   On: 07/18/2015 05:22    Anti-infectives: Anti-infectives    Start     Dose/Rate Route Frequency Ordered Stop   07/18/15 0900  metroNIDAZOLE (FLAGYL) IVPB 500 mg     500 mg 100 mL/hr over 60 Minutes Intravenous Every 6 hours 07/18/15 0854     07/18/15 0900  ciprofloxacin (CIPRO) IVPB 400 mg     400 mg 200 mL/hr over 60 Minutes Intravenous Every 12 hours 07/18/15 0854     07/18/15 0530  ciprofloxacin (CIPRO) IVPB 400 mg     400 mg 200 mL/hr over 60 Minutes Intravenous  Once 07/18/15 0521 07/18/15 0746   07/18/15 0530  metroNIDAZOLE (FLAGYL) IVPB 500 mg     500 mg 100 mL/hr over 60 Minutes Intravenous  Once 07/18/15 0521 07/18/15 0909      Assessment/Plan:  Right-sided colitis.  Etiology unclear.  Infectious seems more likely than neoplastic or diverticulitis. No diarrhea, however, to send studies There is no acute surgical need. I think it is safe to start clear liquid diet. Continue Cipro and Flagyl Assuming the likely probability that this will resolve clinically, would advise colonoscopy in 6-8 weeks.  Hypokalemia.  I have rewritten her IV orders to address this. DVT.  SQ heparin.   LOS: 1 day    Adin Hector 07/19/2015

## 2015-07-19 NOTE — Progress Notes (Signed)
[facility]  PROGRESS NOTE  Melissa [name] [id number] DOB: [birth year] DOA: 07/18/2015 PCP: Mack Hook, MD    Brief HPI:  35 y.o. female, non-English-speaking, with no significant medical history. A Spanish interpreter was used to obtain information for this admission. Patient woke up around 3 AM with mid and upper abdominal pain associated with nausea and vomiting.. Pain intermittent, no aggravating or alleviating factors. No fevers / chills. She has never had this type of pain before. Patient has no history of gastrointestinal problems. She admits to diarrhea last Thursday but otherwise normal BMs. Patient takes no medications at home. No surgical history. Pain much better after IV pain medications.   Principal Problem:   Colitis Active Problems:   PREMATURE ATRIAL CONTRACTIONS   Assessment/Plan: 1. Acute colitis- improving, continue Cipro and Flagyl. General surgery following. No surgical intervention recommended at this time. Dilaudid when necessary for pain, started clear liquid diet. 2. DVT prophylaxis- heparin   DVT prophylaxis: Heparin Code Status: Full code Family Communication: No family at bedside Disposition Plan: Likely home in 1-2 days   Consultants:  Surgery  Procedures:  None   Antibiotics:  Cipro  Flagyl  Subjective: Patient seen and examined, denies any complaints.   Objective: Filed Vitals:   07/18/15 2111 07/19/15 0608 07/19/15 0800 07/19/15 1354  BP: 102/64 95/53  107/73  Pulse: 76 91  92  Temp: 98.9 F (37.2 C) 98.7 F (37.1 C)  98.8 F (37.1 C)  TempSrc: Oral Oral  Oral  Resp: 18   17  Height:   5\' 4"  (1.626 m)   Weight:   68.493 kg (151 lb)   SpO2: 100% 100%  98%    Intake/Output Summary (Last 24 hours) at 07/19/15 1418 Last data filed at 07/19/15 1356  Gross per 24 hour  Intake 3729.58 ml  Output   1450 ml  Net 2279.58 ml   Filed Weights   07/19/15 0800  Weight: 68.493 kg (151 lb)     Examination:  General exam: Appears calm and comfortable  Respiratory system: Clear to auscultation. Respiratory effort normal. Cardiovascular system: S1 & S2 heard, RRR. No JVD, murmurs, rubs, gallops or clicks. No pedal edema. Gastrointestinal system: Abdomen is nondistended, soft and nontender. No organomegaly or masses felt. Normal bowel sounds heard. Central nervous system: Alert and oriented. No focal neurological deficits. Extremities: Symmetric 5 x 5 power. Skin: No rashes, lesions or ulcers Psychiatry: Judgement and insight appear normal. Mood & affect appropriate.    Data Reviewed: I have personally reviewed following labs and imaging studies Basic Metabolic Panel:  Recent Labs Lab 07/18/15 0148 07/19/15 0303  NA 138 139  K 3.7 3.4*  CL 104 107  CO2 26 22  GLUCOSE 125* 76  BUN 14 6  CREATININE 0.70 0.50  CALCIUM 9.3 8.4*   Liver Function Tests:  Recent Labs Lab 07/18/15 0148 07/19/15 0303  AST 25 18  ALT 29 28  ALKPHOS 83 113  BILITOT 0.5 0.5  PROT 7.2 6.6  ALBUMIN 4.0 3.1*    Recent Labs Lab 07/18/15 0148  LIPASE 41   CBC:  Recent Labs Lab 07/18/15 0148 07/19/15 0303  WBC 13.6* 11.3*  HGB 13.9 12.7  HCT 40.0 38.5  MCV 90.5 91.7  PLT 228 205    CBG: No results for input(s): GLUCAP in the last 168 hours.  Recent Results (from the past 240 hour(s))  MRSA PCR Screening     Status: None   Collection Time: 07/19/15 12:13 AM  Result Value  Ref Range Status   MRSA by PCR NEGATIVE NEGATIVE Final    Comment:        The GeneXpert MRSA Assay (FDA approved for NASAL specimens only), is one component of a comprehensive MRSA colonization surveillance program. It is not intended to diagnose MRSA infection nor to guide or monitor treatment for MRSA infections.      Studies: Ct Abdomen Pelvis W Contrast  07/18/2015  CLINICAL DATA:  Initial evaluation for acute abdominal pain. EXAM: CT ABDOMEN AND PELVIS WITH CONTRAST TECHNIQUE:  Multidetector CT imaging of the abdomen and pelvis was performed using the standard protocol following bolus administration of intravenous contrast. CONTRAST:  158mL ISOVUE-300 IOPAMIDOL (ISOVUE-300) INJECTION 61% COMPARISON:  None. FINDINGS: Mild scattered atelectatic changes present within the visualized lung bases. Few small calcified granulomas noted. Partially visualized lungs are otherwise clear. Liver demonstrates a normal contrast enhanced appearance. Gallbladder within normal limits. No biliary dilatation. Spleen, adrenal glands, and pancreas demonstrate a normal contrast enhanced appearance. Kidneys are equal in size with symmetric enhancement. No nephrolithiasis, hydronephrosis, or focal enhancing renal mass. Stomach within normal limits. No evidence for bowel obstruction. Focal inflammatory stranding present about the cecum/ascending colon within the right abdomen (series 2, image 45). There are a few small foci of gas within is area of inflammation at appear to be extraluminal, worrisome for associated micro perforation (series 6, image 48). Few scattered sub cm reactive mesenteric lymph nodes present. Findings suggestive of acute colitis. The adjacent terminal ileum is relatively normal in appearance. Similarly, the appendix is visualized just inferiorly within the right lower quadrant and is of normal caliber without associated inflammatory changes to suggest acute appendicitis. No other significant inflammatory changes about the colon. Mild circumferential bladder wall thickening likely related incomplete distension. Bladder are otherwise unremarkable. Uterus and ovaries within normal limits. No free intraperitoneal air. No free fluid. Again, scattered reactive mesenteric lymph nodes clustered within the right lower quadrant, largest of which measures 8 mm. No pathologically enlarged lymph nodes identified within abdomen and pelvis. Normal intravascular enhancement seen throughout the intra-abdominal  aorta and its branch vessels. No acute osseous abnormality. No worrisome lytic or blastic osseous lesions. IMPRESSION: 1. Inflammatory stranding about the cecum/ascending colon, consistent with acute colitis. Few scattered foci of extraluminal gas within this region concerning for associated micro perforation. No abscess or drainable fluid collection. 2. No other acute intra-abdominal process identified. Critical Value/emergent results were called by telephone at the time of interpretation on 07/18/2015 at 5:18 am to Dr. Joseph Berkshire , who verbally acknowledged these results. Electronically Signed   By: Jeannine Boga M.D.   On: 07/18/2015 05:22    Scheduled Meds: . ciprofloxacin  400 mg Intravenous Q12H  . heparin  5,000 Units Subcutaneous Q8H  . metronidazole  500 mg Intravenous Q6H  . sodium chloride flush  3 mL Intravenous Q12H   Continuous Infusions: . lactated ringers 1,000 mL with potassium chloride 20 mEq infusion 100 mL/hr at 07/19/15 0933       Time spent: 25 min    [facility] Pager [phone]. If 7PM-7AM, please contact night-coverage at [email or web address], Office  [phone]  password [email or web address] 07/19/2015, 2:18 PM  LOS: 1 day

## 2015-07-20 DIAGNOSIS — K529 Noninfective gastroenteritis and colitis, unspecified: Secondary | ICD-10-CM | POA: Diagnosis not present

## 2015-07-20 LAB — CBC
HCT: 37.1 % (ref 36.0–46.0)
Hemoglobin: 12.8 g/dL (ref 12.0–15.0)
MCH: 31.2 pg (ref 26.0–34.0)
MCHC: 34.5 g/dL (ref 30.0–36.0)
MCV: 90.5 fL (ref 78.0–100.0)
PLATELETS: 209 10*3/uL (ref 150–400)
RBC: 4.1 MIL/uL (ref 3.87–5.11)
RDW: 11.6 % (ref 11.5–15.5)
WBC: 10.6 10*3/uL — ABNORMAL HIGH (ref 4.0–10.5)

## 2015-07-20 LAB — BASIC METABOLIC PANEL
Anion gap: 8 (ref 5–15)
CALCIUM: 8.8 mg/dL — AB (ref 8.9–10.3)
CO2: 24 mmol/L (ref 22–32)
CREATININE: 0.52 mg/dL (ref 0.44–1.00)
Chloride: 106 mmol/L (ref 101–111)
GFR calc non Af Amer: >60 mL/min (ref >60)
Glucose, Bld: 111 mg/dL — ABNORMAL HIGH (ref 65–99)
Potassium: 3.9 mmol/L (ref 3.5–5.1)
SODIUM: 138 mmol/L (ref 135–145)

## 2015-07-20 MED ORDER — HYDROCODONE-ACETAMINOPHEN 5-325 MG PO TABS: 1.0000 | ORAL_TABLET | ORAL | Status: DC | PRN

## 2015-07-20 MED ORDER — PROCHLORPERAZINE EDISYLATE 5 MG/ML IJ SOLN
10.0000 mg | Freq: Four times a day (QID) | INTRAMUSCULAR | Status: DC | PRN
  Administered 2015-07-20: 10 mg via INTRAVENOUS
  Filled 2015-07-20 (×2): qty 2

## 2015-07-20 NOTE — Progress Notes (Signed)
[facility]  PROGRESS NOTE  Melissa [name] [id number] DOB: May 30, 1980 DOA: 07/18/2015 PCP: Mack Hook, MD    Brief HPI:  35 y.o. female, non-English-speaking, with no significant medical history. A Spanish interpreter was used to obtain information for this admission. Patient woke up around 3 AM with mid and upper abdominal pain associated with nausea and vomiting.. Pain intermittent, no aggravating or alleviating factors. No fevers / chills. She has never had this type of pain before. Patient has no history of gastrointestinal problems. She admits to diarrhea last Thursday but otherwise normal BMs. Patient takes no medications at home. No surgical history. Pain much better after IV pain medications.   Principal Problem:   Colitis Active Problems:   PREMATURE ATRIAL CONTRACTIONS   Assessment/Plan: 1. Acute colitis- improving, continue Cipro and Flagyl. General surgery following. No surgical intervention recommended at this time. Dilaudid when necessary for pain, started on full liquid diet. 2. DVT prophylaxis- heparin   DVT prophylaxis: Heparin Code Status: Full code Family Communication: No family at bedside Disposition Plan: Likely home in 1-2 days   Consultants:  Surgery  Procedures:  None   Antibiotics:  Cipro  Flagyl  Subjective: Patient seen and examined, denies any complaints.   Objective: Filed Vitals:   07/19/15 1354 07/19/15 1727 07/19/15 2052 07/20/15 0501  BP: 107/73 136/64 106/62 102/61  Pulse: 92 91 83 68  Temp: 98.8 F (37.1 C) 98.6 F (37 C) 98.9 F (37.2 C) 98.3 F (36.8 C)  TempSrc: Oral Oral Oral   Resp: 17  17 16   Height:      Weight:      SpO2: 98% 100% 100% 97%    Intake/Output Summary (Last 24 hours) at 07/20/15 1250 Last data filed at 07/20/15 0600  Gross per 24 hour  Intake 3322.67 ml  Output   1000 ml  Net 2322.67 ml   Filed Weights   07/19/15 0800  Weight: 68.493 kg (151 lb)     Examination:  General exam: Appears calm and comfortable  Respiratory system: Clear to auscultation. Respiratory effort normal. Cardiovascular system: S1 & S2 heard, RRR. No JVD, murmurs, rubs, gallops or clicks. No pedal edema. Gastrointestinal system: Abdomen is nondistended, soft and nontender. No organomegaly or masses felt. Normal bowel sounds heard. Central nervous system: Alert and oriented. No focal neurological deficits. Extremities: Symmetric 5 x 5 power. Skin: No rashes, lesions or ulcers Psychiatry: Judgement and insight appear normal. Mood & affect appropriate.    Data Reviewed: I have personally reviewed following labs and imaging studies Basic Metabolic Panel:  Recent Labs Lab 07/18/15 0148 07/19/15 0303 07/20/15 0345  NA 138 139 138  K 3.7 3.4* 3.9  CL 104 107 106  CO2 26 22 24   GLUCOSE 125* 76 111*  BUN 14 6 <5*  CREATININE 0.70 0.50 0.52  CALCIUM 9.3 8.4* 8.8*   Liver Function Tests:  Recent Labs Lab 07/18/15 0148 07/19/15 0303  AST 25 18  ALT 29 28  ALKPHOS 83 113  BILITOT 0.5 0.5  PROT 7.2 6.6  ALBUMIN 4.0 3.1*    Recent Labs Lab 07/18/15 0148  LIPASE 41   CBC:  Recent Labs Lab 07/18/15 0148 07/19/15 0303 07/20/15 0345  WBC 13.6* 11.3* 10.6*  HGB 13.9 12.7 12.8  HCT 40.0 38.5 37.1  MCV 90.5 91.7 90.5  PLT 228 205 209    CBG: No results for input(s): GLUCAP in the last 168 hours.  Recent Results (from the past 240 hour(s))  MRSA PCR Screening  Status: None   Collection Time: 07/19/15 12:13 AM  Result Value Ref Range Status   MRSA by PCR NEGATIVE NEGATIVE Final    Comment:        The GeneXpert MRSA Assay (FDA approved for NASAL specimens only), is one component of a comprehensive MRSA colonization surveillance program. It is not intended to diagnose MRSA infection nor to guide or monitor treatment for MRSA infections.      Studies: No results found.  Scheduled Meds: . ciprofloxacin  400 mg Intravenous  Q12H  . heparin  5,000 Units Subcutaneous Q8H  . metronidazole  500 mg Intravenous Q6H  . sodium chloride flush  3 mL Intravenous Q12H   Continuous Infusions: . lactated ringers 1,000 mL with potassium chloride 20 mEq infusion 100 mL/hr at 07/19/15 2139       Time spent: 25 min    [facility] Pager [phone]. If 7PM-7AM, please contact night-coverage at [email or web address], Office  [phone]  password [email or web address] 07/20/2015, 12:50 PM  LOS: 2 days

## 2015-07-20 NOTE — Progress Notes (Signed)
  Subjective: Doing well.  Asymptomatic.  Says she had a stool.  Very hungry.  No nausea. Afebrile.  Heart rate 68.  WBC down to 10,600.  Potassium 3.9.  Creatinine 0.5 to  Objective: Vital signs in last 24 hours: Temp:  [98.3 F (36.8 C)-98.9 F (37.2 C)] 98.3 F (36.8 C) (07/16 0501) Pulse Rate:  [68-92] 68 (07/16 0501) Resp:  [16-17] 16 (07/16 0501) BP: (102-136)/(61-73) 102/61 mmHg (07/16 0501) SpO2:  [97 %-100 %] 97 % (07/16 0501) Weight:  [68.493 kg (151 lb)] 68.493 kg (151 lb) (07/15 0800) Last BM Date: 07/17/15  Intake/Output from previous day: 07/15 0701 - 07/16 0700 In: 3712.7 [P.O.:690; I.V.:2222.7; IV Piggyback:800] Out: 1000 [Urine:1000] Intake/Output this shift: Total I/O In: 2016.7 [I.V.:1216.7; IV Piggyback:800] Out: 1000 [Urine:1000]  General appearance: Alert.  Happy.  No distress.  Mental status normal. Resp: clear to auscultation bilaterally GI: Soft and nontender.  No distention.  No mass.  Benign exam.  Lab Results:   Recent Labs  07/19/15 0303 07/20/15 0345  WBC 11.3* 10.6*  HGB 12.7 12.8  HCT 38.5 37.1  PLT 205 209   BMET  Recent Labs  07/19/15 0303 07/20/15 0345  NA 139 138  K 3.4* 3.9  CL 107 106  CO2 22 24  GLUCOSE 76 111*  BUN 6 <5*  CREATININE 0.50 0.52  CALCIUM 8.4* 8.8*   PT/INR No results for input(s): LABPROT, INR in the last 72 hours. ABG No results for input(s): PHART, HCO3 in the last 72 hours.  Invalid input(s): PCO2, PO2  Studies/Results: No results found.  Anti-infectives: Anti-infectives    Start     Dose/Rate Route Frequency Ordered Stop   07/18/15 0900  metroNIDAZOLE (FLAGYL) IVPB 500 mg     500 mg 100 mL/hr over 60 Minutes Intravenous Every 6 hours 07/18/15 0854     07/18/15 0900  ciprofloxacin (CIPRO) IVPB 400 mg     400 mg 200 mL/hr over 60 Minutes Intravenous Every 12 hours 07/18/15 0854     07/18/15 0530  ciprofloxacin (CIPRO) IVPB 400 mg     400 mg 200 mL/hr over 60 Minutes Intravenous  Once  07/18/15 0521 07/18/15 0746   07/18/15 0530  metroNIDAZOLE (FLAGYL) IVPB 500 mg     500 mg 100 mL/hr over 60 Minutes Intravenous  Once 07/18/15 0521 07/18/15 0909      Assessment/Plan:  Right-sided colitis. Etiology unclear. Infectious seems more likely than neoplastic or diverticulitis. No diarrhea, however, to send studies There is no acute surgical need. Advance to full liquid diet Continue Cipro and Flagyl Home in 24-48 hours. Recommend 10 day course of antibiotics total. Assuming the likely probability that this will resolve clinically, would advise colonoscopy in 6-8 weeks.  Hypokalemia.  Resolved DVT. SQ heparin.   LOS: 2 days    Chetan Mehring M 07/20/2015

## 2015-07-21 DIAGNOSIS — K529 Noninfective gastroenteritis and colitis, unspecified: Secondary | ICD-10-CM | POA: Diagnosis not present

## 2015-07-21 MED ORDER — METRONIDAZOLE 500 MG PO TABS: 500.0000 mg | ORAL_TABLET | Freq: Three times a day (TID) | ORAL | Status: DC

## 2015-07-21 MED ORDER — CIPROFLOXACIN HCL 500 MG PO TABS: 500.0000 mg | ORAL_TABLET | Freq: Two times a day (BID) | ORAL | Status: DC

## 2015-07-21 NOTE — Care Management Note (Signed)
Case Management Note  Patient Details  Name: Melissa [name] MRN: [id number] Date of Birth: 07-22-1980  Subjective/Objective:                    Action/Plan:   Expected Discharge Date:                  Expected Discharge Plan:  Home/Self Care  In-House Referral:     Discharge planning Services  CM Consult, Medication Assistance, [facility], [facility] Program  Post Acute Care Choice:    Choice offered to:     DME Arranged:    DME Agency:     HH Arranged:    [place] Agency:     Status of Service:  Completed, signed off  If discussed at [employer], dates discussed:    Additional Comments:  Marilu Favre, RN 07/21/2015, 1:33 PM

## 2015-07-21 NOTE — Progress Notes (Addendum)
Discharge papers given to pt. In [place] and [place]. Went over d/c papers. NO questions/complaints. IV taken out. Waiting on MD to write work letter and match letter to be faxed.   Match letter and work note given to pt. Pt. Refused w/c. D/c'd successfully with her ride.

## 2015-07-21 NOTE — Progress Notes (Signed)
[place] responded to a spiritual consult for prayer. The patient was in good spirits. There was some difficulty in communication, but gathered that she wanted prayer for her continued recovery. I offered prayer, and am available for follow up as needed.  Darene Lamer Dalilah Curlin    07/21/15 0900  Clinical Encounter Type  Visited With Patient;Health care provider  Visit Type Initial;Spiritual support  Referral From Nurse  Spiritual Encounters  Spiritual Needs Prayer;Emotional

## 2015-07-21 NOTE — Discharge Summary (Signed)
Physician Discharge Summary  Melissa Melissa [name] [id number] DOB: 12/03/1980 DOA: 07/18/2015  PCP: Mack Hook, MD  Admit date: 07/18/2015 Discharge date: 07/21/2015  Time spent: 25* minutes  Recommendations for Outpatient Follow-up:  1. Will need colonoscopy as outpatient in 4-Melissa weeks 2. Follow up PCP in 2 weeks   Discharge Diagnoses:  Principal Problem:   Colitis Active Problems:   PREMATURE ATRIAL CONTRACTIONS   Discharge Condition: Stable  Diet recommendation: Regular diet  Filed Weights   07/19/15 0800  Weight: 68.493 kg (151 lb)    History of present illness:  35 y.o. Melissa [name], non-English-speaking, with no significant medical history. A Spanish interpreter was used to obtain information for this admission. Patient woke up around 3 AM with mid and upper abdominal pain associated with nausea and vomiting.. Pain intermittent, no aggravating or alleviating factors. No fevers / chills. She has never had this type of pain before. Patient has no history of gastrointestinal problems. She admits to diarrhea last Thursday but otherwise normal BMs. Patient takes no medications at home. No surgical history. Pain much better after IV pain medications.   Hospital Course:  1. Acute colitis- Patient was started on  Cipro and Flagyl. General surgery saw the patient in the ED. No surgical intervention recommended at this time. Will discharge home on Cipro and Flagyl for Melissa more days . She will need colonoscopy in 4-Melissa weeks.  Procedures:  None  Consultations:  Surgery  Discharge Exam: Filed Vitals:   07/20/15 2115 07/21/15 0438  BP: 106/70 115/72  Pulse: 71 73  Temp: 97.7 F (36.5 C) 98.5 F (36.9 C)  Resp: 17 17    General: Appears in no acute distress Cardiovascular: S1S2 RRR Respiratory: Clear bilaterally Ext - No edema  Discharge Instructions   Discharge Instructions    Diet - low sodium heart healthy    Complete by:  As directed      Increase activity  slowly    Complete by:  As directed           Current Discharge Medication List    START taking these medications   Details  ciprofloxacin (CIPRO) 500 MG tablet Take 1 tablet (500 mg total) by mouth 2 (two) times daily. Qty: 12 tablet, Refills: 0    metroNIDAZOLE (FLAGYL) 500 MG tablet Take 1 tablet (500 mg total) by mouth 3 (three) times daily. Qty: 18 tablet, Refills: 0      STOP taking these medications     ibuprofen (ADVIL,MOTRIN) 200 MG tablet        No Known Allergies    The results of significant diagnostics from this hospitalization (including imaging, microbiology, ancillary and laboratory) are listed below for reference.    Significant Diagnostic Studies: Ct Abdomen Pelvis W Contrast  07/18/2015  CLINICAL DATA:  Initial evaluation for acute abdominal pain. EXAM: CT ABDOMEN AND PELVIS WITH CONTRAST TECHNIQUE: Multidetector CT imaging of the abdomen and pelvis was performed using the standard protocol following bolus administration of intravenous contrast. CONTRAST:  154mL ISOVUE-300 IOPAMIDOL (ISOVUE-300) INJECTION 61% COMPARISON:  None. FINDINGS: Mild scattered atelectatic changes present within the visualized lung bases. Few small calcified granulomas noted. Partially visualized lungs are otherwise clear. Liver demonstrates a normal contrast enhanced appearance. Gallbladder within normal limits. No biliary dilatation. Spleen, adrenal glands, and pancreas demonstrate a normal contrast enhanced appearance. Kidneys are equal in size with symmetric enhancement. No nephrolithiasis, hydronephrosis, or focal enhancing renal mass. Stomach within normal limits. No evidence for bowel obstruction. Focal inflammatory stranding present about the  cecum/ascending colon within the right abdomen (series 2, image 45). There are a few small foci of gas within is area of inflammation at appear to be extraluminal, worrisome for associated micro perforation (series Melissa, image 48). Few scattered sub  cm reactive mesenteric lymph nodes present. Findings suggestive of acute colitis. The adjacent terminal ileum is relatively normal in appearance. Similarly, the appendix is visualized just inferiorly within the right lower quadrant and is of normal caliber without associated inflammatory changes to suggest acute appendicitis. No other significant inflammatory changes about the colon. Mild circumferential bladder wall thickening likely related incomplete distension. Bladder are otherwise unremarkable. Uterus and ovaries within normal limits. No free intraperitoneal air. No free fluid. Again, scattered reactive mesenteric lymph nodes clustered within the right lower quadrant, largest of which measures 8 mm. No pathologically enlarged lymph nodes identified within abdomen and pelvis. Normal intravascular enhancement seen throughout the intra-abdominal aorta and its branch vessels. No acute osseous abnormality. No worrisome lytic or blastic osseous lesions. IMPRESSION: 1. Inflammatory stranding about the cecum/ascending colon, consistent with acute colitis. Few scattered foci of extraluminal gas within this region concerning for associated micro perforation. No abscess or drainable fluid collection. 2. No other acute intra-abdominal process identified. Critical Value/emergent results were called by telephone at the time of interpretation on 07/18/2015 at 5:18 am to Dr. Joseph Berkshire , who verbally acknowledged these results. Electronically Signed   By: Jeannine Boga M.D.   On: 07/18/2015 05:22    Microbiology: Recent Results (from the past 240 hour(s))  MRSA PCR Screening     Status: None   Collection Time: 07/19/15 12:13 AM  Result Value Ref Range Status   MRSA by PCR NEGATIVE NEGATIVE Final    Comment:        The GeneXpert MRSA Assay (FDA approved for NASAL specimens only), is one component of a comprehensive MRSA colonization surveillance program. It is not intended to diagnose  MRSA infection nor to guide or monitor treatment for MRSA infections.      Labs: Basic Metabolic Panel:  Recent Labs Lab 07/18/15 0148 07/19/15 0303 07/20/15 0345  NA 138 139 138  K 3.7 3.4* 3.9  CL 104 107 106  CO2 26 22 24   GLUCOSE 125* 76 111*  BUN 14 Melissa <5*  CREATININE 0.70 0.50 0.52  CALCIUM 9.3 8.4* 8.8*   Liver Function Tests:  Recent Labs Lab 07/18/15 0148 07/19/15 0303  AST 25 18  ALT 29 28  ALKPHOS 83 113  BILITOT 0.5 0.5  PROT 7.2 Melissa.Melissa  ALBUMIN 4.0 3.1*    Recent Labs Lab 07/18/15 0148  LIPASE 41   No results for input(s): AMMONIA in the last 168 hours. CBC:  Recent Labs Lab 07/18/15 0148 07/19/15 0303 07/20/15 0345  WBC 13.Melissa* 11.3* 10.Melissa*  HGB 13.9 12.7 12.8  HCT 40.0 38.5 37.1  MCV 90.5 91.7 90.5  PLT 228 205 209        Signed:  Opie Fanton S MD.  [facility] 07/21/2015, 10:29 AM

## 2015-07-21 NOTE — Progress Notes (Signed)
  Subjective: Denies abdominal pain, nausea and vomiting. Tolerating full liquid diet. Ambulating. VSS. WBC trending down.  Objective: Vital signs in last 24 hours: Temp:  [97.7 F (36.5 C)-98.5 F (36.9 C)] 98.5 F (36.9 C) (07/17 0438) Pulse Rate:  [71-90] 73 (07/17 0438) Resp:  [17] 17 (07/17 0438) BP: (106-118)/(70-75) 115/72 mmHg (07/17 0438) SpO2:  [98 %-100 %] 100 % (07/17 0438) Last BM Date: 07/20/15  Intake/Output from previous day: 07/16 0701 - 07/17 0700 In: 2660 [P.O.:360; I.V.:1600; IV Piggyback:700] Out: 1100 [Urine:1100] Intake/Output this shift:    General appearance  Supine. Awake and talkative.  Head: Normocephalic/atraumatic. PERRL bilaterally. Extraocular eye muscles in tact.  Neck: FROM Cardiovascular: Regular rate and rhythm without gallops, murmurs or rubs Respiratory: Lung sounds vesicular. No wheezes, crackles or rhonchi auscultated Abdomen: Soft and non tender without guarding, masses, or rigidity. +BS Musculoskeletal: 5/5 strength in the upper and lower extremities bilaterally  Lab Results:   Recent Labs  07/19/15 0303 07/20/15 0345  WBC 11.3* 10.6*  HGB 12.7 12.8  HCT 38.5 37.1  PLT 205 209   BMET  Recent Labs  07/19/15 0303 07/20/15 0345  NA 139 138  K 3.4* 3.9  CL 107 106  CO2 22 24  GLUCOSE 76 111*  BUN 6 <5*  CREATININE 0.50 0.52  CALCIUM 8.4* 8.8*   PT/INR No results for input(s): LABPROT, INR in the last 72 hours. ABG No results for input(s): PHART, HCO3 in the last 72 hours.  Invalid input(s): PCO2, PO2  Studies/Results: No results found.  Anti-infectives: Anti-infectives    Start     Dose/Rate Route Frequency Ordered Stop   07/18/15 0900  metroNIDAZOLE (FLAGYL) IVPB 500 mg     500 mg 100 mL/hr over 60 Minutes Intravenous Every 6 hours 07/18/15 0854     07/18/15 0900  ciprofloxacin (CIPRO) IVPB 400 mg     400 mg 200 mL/hr over 60 Minutes Intravenous Every 12 hours 07/18/15 0854     07/18/15 0530   ciprofloxacin (CIPRO) IVPB 400 mg     400 mg 200 mL/hr over 60 Minutes Intravenous  Once 07/18/15 0521 07/18/15 0746   07/18/15 0530  metroNIDAZOLE (FLAGYL) IVPB 500 mg     500 mg 100 mL/hr over 60 Minutes Intravenous  Once 07/18/15 0521 07/18/15 [id number]      Assessment/Plan: Colitis of cecum and ascending colon No diarrhea. WBC trending down (10.6 from 11.3 on 07/20/15)  FEN: Full liquids   ID: Metronidazole and Ciprofloxacin for next 6 days (10 days total) VTE: [place] Heparin  Dispo: Home today   She has no pain on exam, having flatus, would dc home today and needs colonoscopy evaluation by gi once resolved  Lannie Fields PASII 07/21/2015

## 2015-08-06 ENCOUNTER — Encounter: Payer: Self-pay | Admitting: Internal Medicine

## 2015-08-06 ENCOUNTER — Ambulatory Visit (INDEPENDENT_AMBULATORY_CARE_PROVIDER_SITE_OTHER): Payer: No Typology Code available for payment source | Admitting: Internal Medicine

## 2015-08-06 VITALS — BP 110/60 | HR 66 | Resp 16 | Ht 59.0 in | Wt 146.0 lb

## 2015-08-06 DIAGNOSIS — M7711 Lateral epicondylitis, right elbow: Secondary | ICD-10-CM

## 2015-08-06 DIAGNOSIS — G5601 Carpal tunnel syndrome, right upper limb: Secondary | ICD-10-CM

## 2015-08-06 DIAGNOSIS — K529 Noninfective gastroenteritis and colitis, unspecified: Secondary | ICD-10-CM

## 2015-08-06 NOTE — Progress Notes (Signed)
   Subjective:    Patient ID: Melissa [name], female    DOB: Aug 23, 1980, 35 y.o.   MRN: [id number]  HPI   1.  Acute Colitis:  Hospitalized July 14th for 3 days with colitis of the cecum and ascending colon and findings concerning for microperforation with no involvement of the appendix.   Was treated with Flagyl and Cipro for a total of 10 days.  Finished the course sometime last week.   Did not appear toxic on admission and white count  Elevated to 13.6 on admission and down to 10.6 day of discharge. She has been eating a regular diet and has had no abdominal pain since discharge.  No problems with diet.  Having normal formed stools.  No melena or hematochezia.  No fever.  Appetite is good.   Needs follow up colonoscopy.  Was not scheduled at discharge.  Will see if can get set up through [facility].    2.  Right Carpal Tunnel syndrome:  Wearing cock up splint and this resolved her problems  3.  Right Lateral Epicondylitis:  Was doing really well, able to do what she needed with the velcro arm splint, but after an IV was placed during hospitalization in her right dorsal forearm, she has had numbness there.   No outpatient prescriptions have been marked as taking for the 08/06/15 encounter (Office Visit) with Mack Hook, MD.    No Known Allergies      Review of Systems     Objective:   Physical Exam   NAD Lungs:  CTA CV:  RRR without murmur or rub, radial and DP pulses normal and equal Abd:  S, NT, No HSM or mass, + BS Right forearm:  No concerning findings where IV was. LE:  No edema.        Assessment & Plan:  1.  Acute colitis of cecum and ascending colon with concern for microperforation:  Improved following course of antibiotics.  Needs follow up colonoscopy.  Referral made  2.  Carpal Tunnel Syndrome:  Resolved with cock up splints.  To use as needed at bedtime to control any recurrent symptoms.  3.  Right Lateral Epicondylitis:  Resolved with velcro  arm splint.  Again, utilize as needed for recurrent symptoms

## 2015-09-12 ENCOUNTER — Telehealth: Payer: Self-pay | Admitting: Internal Medicine

## 2015-09-12 NOTE — Telephone Encounter (Signed)
Patient called to inquire about Colonoscopy referral through [facility] and to see if Dr. Amil Amen had received the " tool/equipment" to look and remove a mole that patient has. Patient states Dr. Amil Amen told her to call back in a month  FYI: slot available for this month through [facility] if the patient has a medical reason for the colonoscopy. [facility] doesn't do screenings. Patient required to have certified medical interpreter at the time of the appointment. [facility] will contact patient directly to set up appointment.es

## 2015-09-17 NOTE — Telephone Encounter (Signed)
Referral faxed to [facility] for scheduling

## 2015-09-25 ENCOUNTER — Encounter: Payer: Self-pay | Admitting: Internal Medicine

## 2015-09-25 ENCOUNTER — Encounter: Payer: Self-pay | Admitting: Gastroenterology

## 2015-09-25 ENCOUNTER — Ambulatory Visit (INDEPENDENT_AMBULATORY_CARE_PROVIDER_SITE_OTHER): Payer: No Typology Code available for payment source | Admitting: Internal Medicine

## 2015-09-25 VITALS — BP 120/62 | HR 60 | Resp 18 | Ht 59.0 in | Wt 152.0 lb

## 2015-09-25 DIAGNOSIS — D229 Melanocytic nevi, unspecified: Secondary | ICD-10-CM

## 2015-09-25 DIAGNOSIS — K529 Noninfective gastroenteritis and colitis, unspecified: Secondary | ICD-10-CM

## 2015-09-25 DIAGNOSIS — L853 Xerosis cutis: Secondary | ICD-10-CM

## 2015-09-25 MED ORDER — TRIAMCINOLONE ACETONIDE 0.1 % EX CREA: 1.0000 "application " | TOPICAL_CREAM | Freq: Two times a day (BID) | CUTANEOUS | 0 refills | Status: DC

## 2015-09-25 NOTE — Progress Notes (Addendum)
   Subjective:    Patient ID: Melissa [name], female    DOB: [birth year], 35 y.o.   MRN: [id number]  HPI  1.  Acute Colitis with possible microperforation. Doing fine.  Occasionally has some mild abdominal discomfort that is fleeting--a second or so.  Nothing like had before.  Has gained weight--appetite is good.  We have a referral in for follow up colonoscopy to [facility], but have not yet heard back yet. Pt. States she has a referral appt., she is not sure for what on Nov 7th at 1 p.m., which likely is her appt.  We later found out the appt. Is for the 29th of November.  2.  Lesion on right back:  Has had since a baby.  Has enlarged with time.  Has had itching nearby and scratched until the lesion bled in the past--but the lesion itself does not itch  3.  Itchy skin :  Bilateral upper back over scapulae and inner thighs.  Comes and goes.  Using Eucerin cream  No outpatient prescriptions have been marked as taking for the 09/25/15 encounter (Office Visit) with Mack Hook, MD.   No Known Allergies    Review of Systems     Objective:   Physical Exam  Abd:  S, NT, No HSM or mass, + BS Skin:  Linear welts where she has scratched, left upper back and inner thighs with some mild erythema about the scratch marks. Right upper back 0.5 cm dark brown well circumscribed lesion with minimal elevation.  No flaking or irritation about the lesion       Assessment & Plan:  1.  Hospitalization in July with acute colitis and possible microperforations.  Pt. Set up for colonoscopy in November, which is a bit out, but she is asymptomatic currently.  Discussed if she begins to have previous symptoms of colitis, to call and be seen immediately in the intervening time.  2.  Dry Skin:  Add Triamcinolone cream 0.1% twice daily to areas of itching for 14 days, then utilize only as needed thereafter.  To continue twice daily Eucerin with oatmeal for eczema relief.  3.  Skin Lesion, right upper back:   Appears benign.  4.  CPE without pap in 3-4 months.  Had last pap 2015 at [facility] --send for records.  Pap always normal

## 2015-10-15 ENCOUNTER — Other Ambulatory Visit: Payer: Self-pay | Admitting: Internal Medicine

## 2015-10-15 DIAGNOSIS — L853 Xerosis cutis: Secondary | ICD-10-CM

## 2015-10-15 NOTE — Telephone Encounter (Signed)
Patient called requesting refill for triamcinolone cream (KENALOG) 0.1 %. Please send to default pharmacy.

## 2015-10-21 NOTE — Telephone Encounter (Signed)
Please check with patient--is she using the Triamcinolone cream a lot?  It was a large tube (80 g) and am concerned why she is needing so soon.

## 2015-10-24 NOTE — Telephone Encounter (Signed)
Called patient several times and no answer. [place] [facility]. Will let patient know she needs to contact us before before they can dispense the Triamcinolon

## 2015-10-24 NOTE — Telephone Encounter (Signed)
Use it for 14 days 2x a day on areas that itch. Patient states her back and abdomen were itching and she only applied it on those areas. Patient wants to know is she needs to continue to use it. Abdomen and back are not itchy anymore. Now is her arms that itch when she is out in the [place]

## 2015-12-03 ENCOUNTER — Ambulatory Visit: Payer: Medicaid Other | Admitting: Gastroenterology

## 2015-12-12 IMAGING — US US OB DETAIL+14 WK
1 series · 12 of 28 positions shown · non-contrast
Comparison: none

[Series 1: us ob detail +14 wk · 12 of 104 slices shown]
[im 4/104]
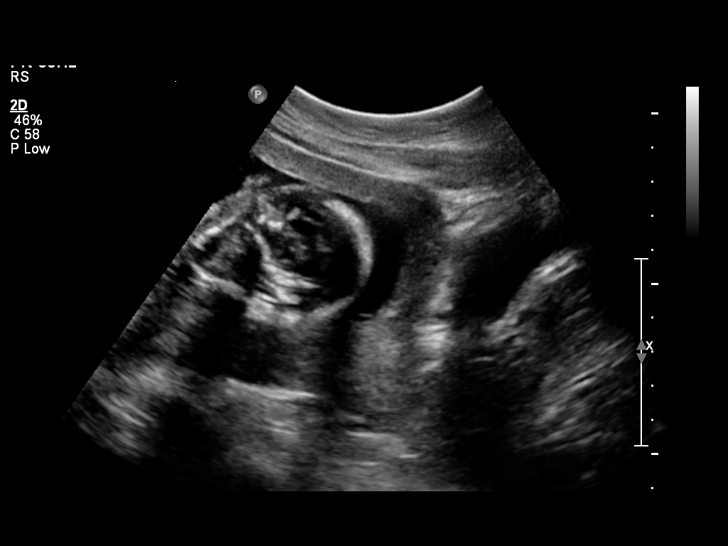
[im 12/104]
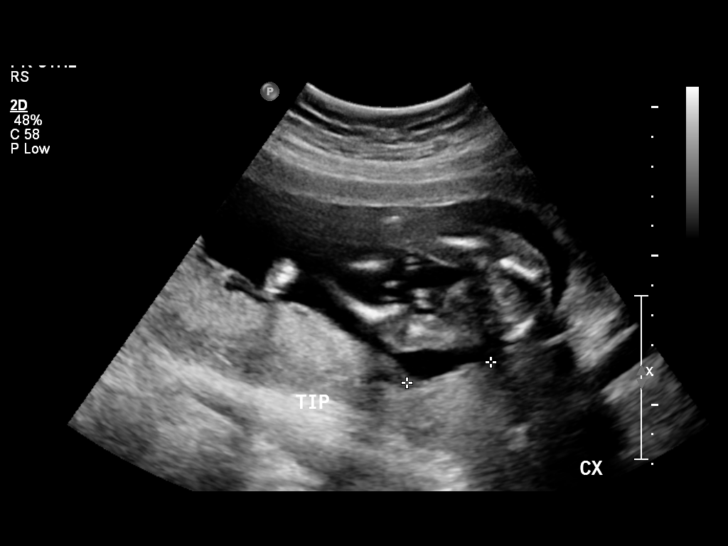
[im 20/104]
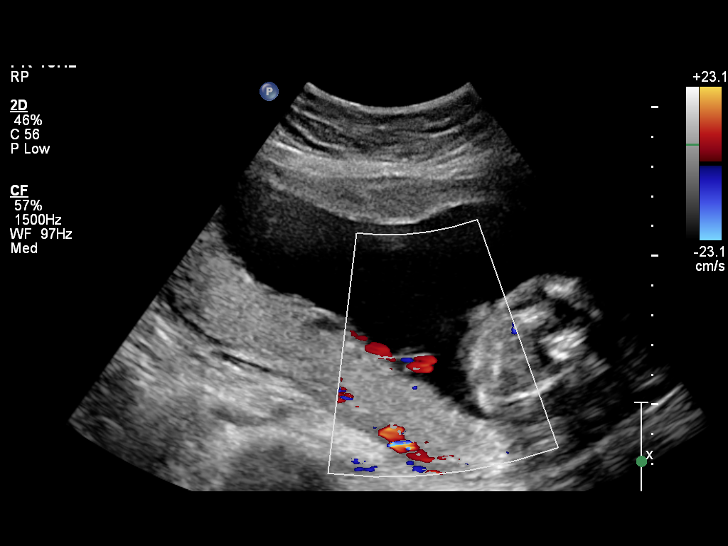
[im 31/104]
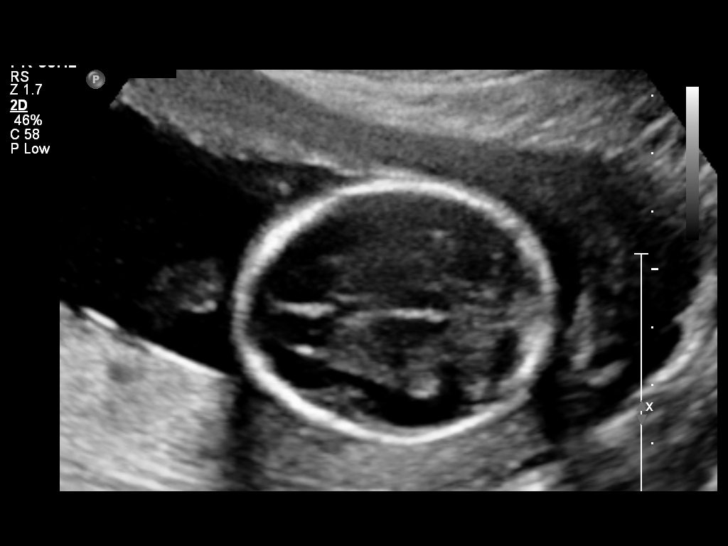
[im 39/104]
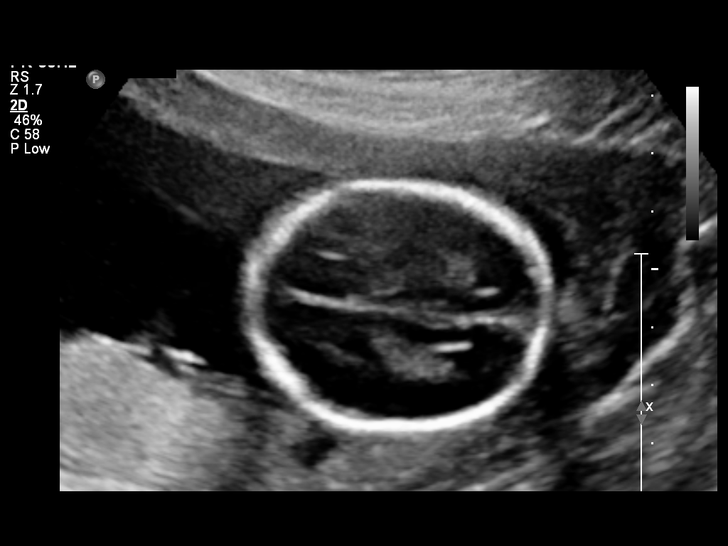
[im 46/104]
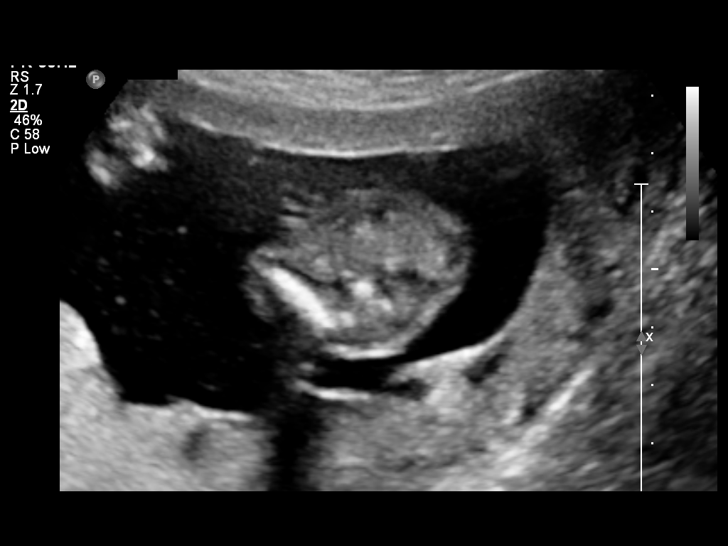
[im 58/104]
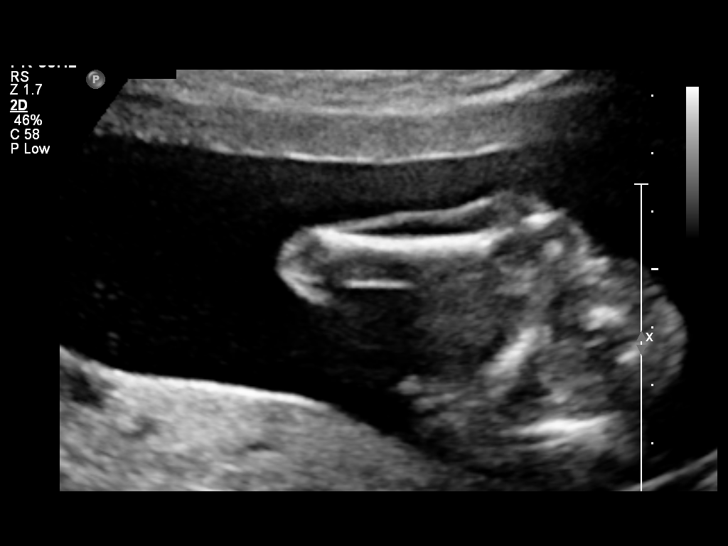
[im 65/104]
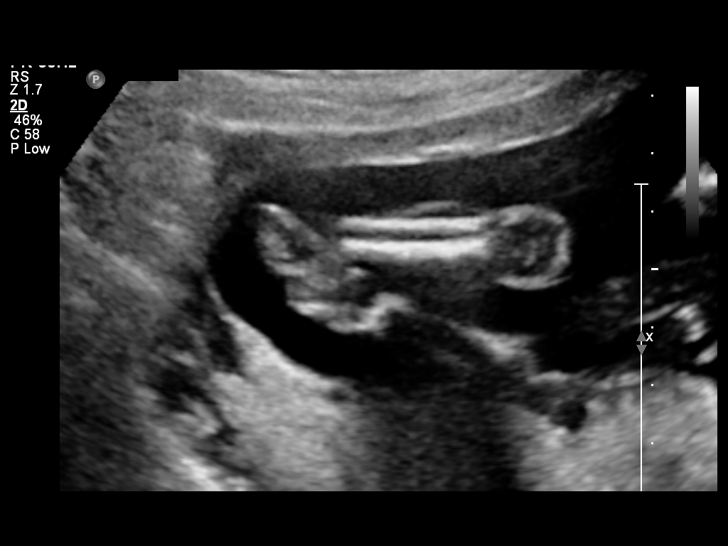
[im 73/104]
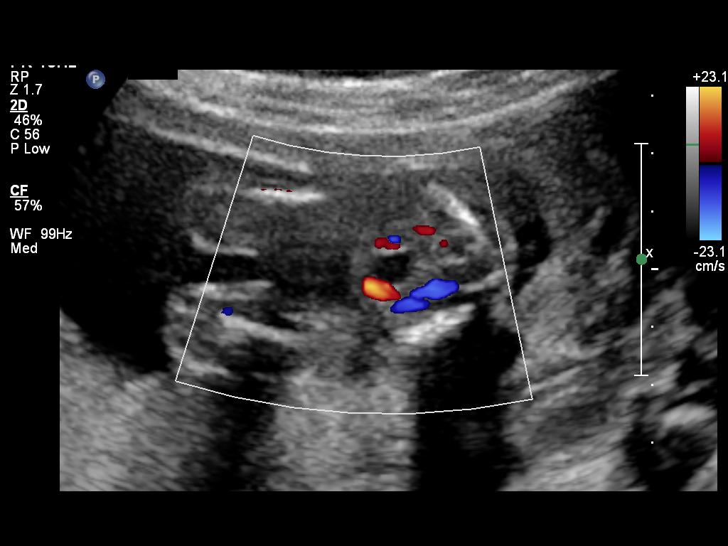
[im 84/104]
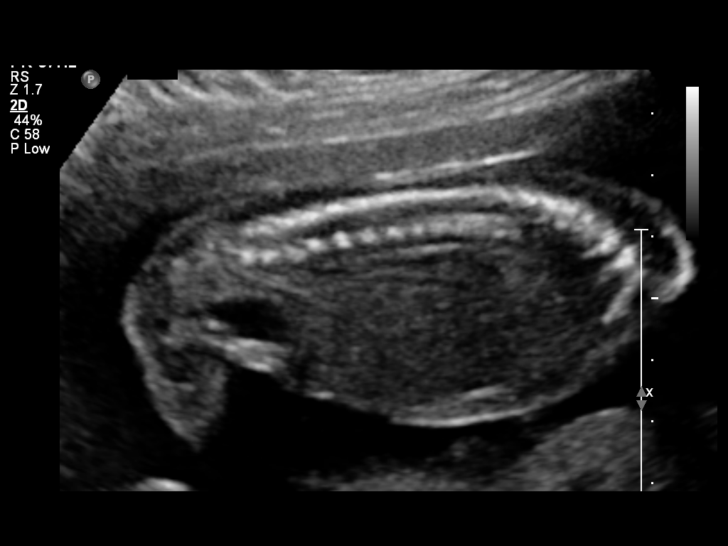
[im 92/104]
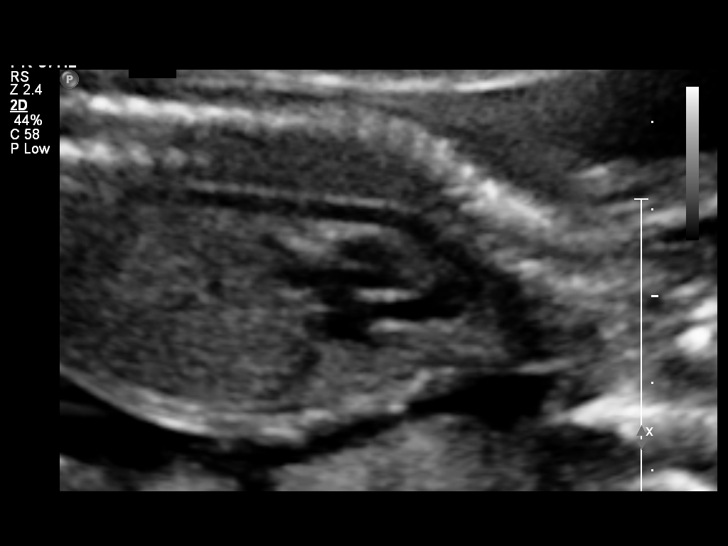
[im 100/104]
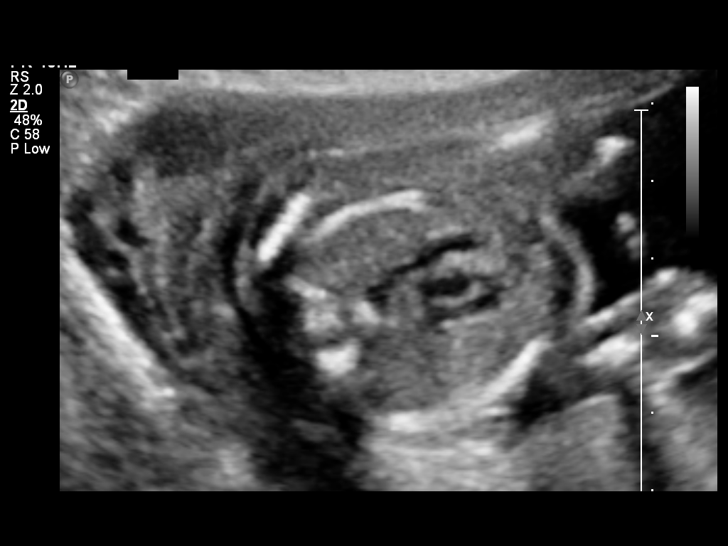

[12 of 28 positions shown; findings below may reference images not displayed]

OBSTETRICS REPORT
                      (Signed Final 07/09/2013 [DATE])

Service(s) Provided

 US OB DETAIL + 14 WK                                  76811.0
Indications

 Diabetes - Gestational, A1 (diet controlled)
 Detailed fetal anatomic survey
Fetal Evaluation

 Num Of Fetuses:    1
 Fetal Heart Rate:  138                          bpm
 Cardiac Activity:  Observed
 Presentation:      Cephalic
 Placenta:          Posterior, above cervical
                    os
 P. Cord            Visualized
 Insertion:

 Amniotic Fluid
 AFI FV:      Subjectively within normal limits
                                             Larg Pckt:    4.76  cm
Biometry

 BPD:     43.1  mm     G. Age:  19w 0d                CI:        78.65   70 - 86
                                                      FL/HC:      18.7   16.1 -

 HC:     153.7  mm     G. Age:  18w 3d       20  %    HC/AC:      1.14   1.09 -

 AC:     135.3  mm     G. Age:  19w 0d       50  %    FL/BPD:
 FL:      28.8  mm     G. Age:  18w 6d       43  %    FL/AC:      21.3   20 - 24
 HUM:     28.7  mm     G. Age:  19w 2d       63  %

 Est. FW:     262  gm      0 lb 9 oz     45  %
Gestational Age

 LMP:           20w 3d        Date:  02/16/13                 EDD:   11/23/13
 U/S Today:     18w 6d                                        EDD:   12/04/13
 Best:          18w 6d     Det. By:  U/S (07/09/13)           EDD:   12/04/13
Anatomy

 Cranium:          Appears normal         Aortic Arch:      Appears normal
 Fetal Cavum:      Appears normal         Ductal Arch:      Appears normal
 Ventricles:       Appears normal         Diaphragm:        Appears normal
 Choroid Plexus:   Appears normal         Stomach:          Appears normal, left
                                                            sided
 Cerebellum:       Appears normal         Abdomen:          Appears normal
 Posterior Fossa:  Appears normal         Abdominal Wall:   Appears nml (cord
                                                            insert, abd wall)
 Nuchal Fold:      Not well visualized    Cord Vessels:     Appears normal (3
                                                            vessel cord)
 Face:             Orbits appear          Kidneys:          Appear normal
                   normal
 Lips:             Not well visualized    Bladder:          Appears normal
 Heart:            Not well visualized    Spine:            Appears normal
 RVOT:             Appears normal         Lower             Appears normal
                                          Extremities:
 LVOT:             Appears normal         Upper             Appears normal
                                          Extremities:

 Other:  Fetus appears to be a female. Heels appears normal. Technically
         difficult due to advanced GA and fetal position.
Cervix Uterus Adnexa

 Cervical Length:    4.39     cm

 Cervix:       Normal appearance by transabdominal scan.
 Uterus:       No abnormality visualized.
 Cul De Sac:   No free fluid seen.
 Left Ovary:    Within normal limits.
 Right Ovary:   Within normal limits.

 Adnexa:     No abnormality visualized.
Impression

 Single IUP at 18w 6d by ultrasound today
 Normal anatomic fetal survey
 Somewhat limited views of the fetal heart obtained due to
 fetal position
 No markers associated with aneuploidy noted
 Posterior placenta without previa
 Normal amniotic fluid volume
Recommendations

 Recommend follow-up ultrasound examination in 4 weeks to
 reevaluate the fetal heart

 BLAIN with us.  Please do not hesitate to

## 2015-12-22 ENCOUNTER — Ambulatory Visit (INDEPENDENT_AMBULATORY_CARE_PROVIDER_SITE_OTHER): Payer: Self-pay | Admitting: Internal Medicine

## 2015-12-22 ENCOUNTER — Encounter: Payer: Self-pay | Admitting: Internal Medicine

## 2015-12-22 VITALS — BP 112/60 | HR 60 | Resp 12 | Ht 59.0 in | Wt 153.0 lb

## 2015-12-22 DIAGNOSIS — R350 Frequency of micturition: Secondary | ICD-10-CM

## 2015-12-22 LAB — POCT URINALYSIS DIPSTICK
BILIRUBIN UA: NEGATIVE
GLUCOSE UA: NEGATIVE
KETONES UA: NEGATIVE
Nitrite, UA: NEGATIVE
PH UA: 5
Protein, UA: 15
RBC UA: NEGATIVE
Spec Grav, UA: 1.02
Urobilinogen, UA: 0.2

## 2015-12-22 MED ORDER — CIPROFLOXACIN HCL 500 MG PO TABS: 500.0000 mg | ORAL_TABLET | Freq: Two times a day (BID) | ORAL | 0 refills | Status: DC

## 2015-12-22 NOTE — Progress Notes (Signed)
   Subjective:    Patient ID: Melissa [name], female    DOB: [birth year], 35 y.o.   MRN: [id number]  HPI  Having inguinal or pelvic pain for 10 days.  The pain comes and goes.   She does not feel it occurs with anything she does.  She describes the pain like she has had with a UTI in the past.  The pain is better after urinating.  She is having urinary frequency. No dysuria--feels better with urination No hematuria.   No fever No pain in flank areas No urinary odor.   She is drinking lots of water.   No vaginal discharge.   LMP:  02/2015:  Has Nexplanon.    No diarrhea, constipation or problems with eating or defecation.    Current Meds  Medication Sig  . etonogestrel (NEXPLANON) 68 MG IMPL implant 1 each by Subdermal route once.    No Known Allergies     Review of Systems     Objective:   Physical Exam NAD Lungs:  CTA CV:  RRR without murmur or rub Abd:  S, suprapubic tenderness.  No HSM or mass, + BS.  No flank tenderness   UA with small Leukocytes and negative nitrites      Assessment & Plan:  Urinary Frequency:  Possible cystitis.  Urine for culture.  Cipro 500 mg twice daily for 6 days.  Push fluids.  Call if worsens.

## 2015-12-24 LAB — URINE CULTURE

## 2016-01-22 ENCOUNTER — Encounter: Payer: Self-pay | Admitting: Internal Medicine

## 2016-02-02 ENCOUNTER — Encounter (HOSPITAL_COMMUNITY): Payer: Self-pay

## 2016-04-01 ENCOUNTER — Ambulatory Visit: Payer: Self-pay | Admitting: Internal Medicine

## 2016-04-12 ENCOUNTER — Telehealth: Payer: Self-pay | Admitting: Internal Medicine

## 2016-04-12 ENCOUNTER — Other Ambulatory Visit (INDEPENDENT_AMBULATORY_CARE_PROVIDER_SITE_OTHER): Payer: Self-pay

## 2016-04-12 DIAGNOSIS — R350 Frequency of micturition: Secondary | ICD-10-CM

## 2016-04-12 LAB — POCT URINALYSIS DIPSTICK
BILIRUBIN UA: NEGATIVE
Blood, UA: NEGATIVE
Glucose, UA: NEGATIVE
KETONES UA: NEGATIVE
LEUKOCYTES UA: NEGATIVE
Nitrite, UA: NEGATIVE
PH UA: 5 (ref 5.0–8.0)
Protein, UA: 15
Spec Grav, UA: 1.005 (ref 1.030–1.035)
Urobilinogen, UA: 0.2 (ref ?–2.0)

## 2016-04-12 NOTE — Telephone Encounter (Signed)
Patient coming in for fasting labs on 04/13/16

## 2016-04-12 NOTE — Telephone Encounter (Signed)
Patient called stating she had her CPE done at the [facility] when she was seen to have her Nexplanon removed but they did not do any lab work. Patient states Dr. Amil Amen told her that she needs to come in to have her sugar checked since she had Gestational Diabetes. Please advise

## 2016-04-13 ENCOUNTER — Other Ambulatory Visit: Payer: Self-pay

## 2016-04-13 ENCOUNTER — Other Ambulatory Visit (INDEPENDENT_AMBULATORY_CARE_PROVIDER_SITE_OTHER): Payer: Self-pay

## 2016-04-13 DIAGNOSIS — R739 Hyperglycemia, unspecified: Secondary | ICD-10-CM

## 2016-04-13 DIAGNOSIS — N898 Other specified noninflammatory disorders of vagina: Secondary | ICD-10-CM

## 2016-04-13 DIAGNOSIS — D649 Anemia, unspecified: Secondary | ICD-10-CM

## 2016-04-13 DIAGNOSIS — Z13220 Encounter for screening for lipoid disorders: Secondary | ICD-10-CM

## 2016-04-13 LAB — POCT WET PREP WITH KOH
KOH PREP POC: NEGATIVE
TRICHOMONAS UA: NEGATIVE
Yeast Wet Prep HPF POC: NEGATIVE

## 2016-04-13 MED ORDER — METRONIDAZOLE 500 MG PO TABS: ORAL_TABLET | ORAL | 0 refills | Status: DC

## 2016-04-13 MED ORDER — METRONIDAZOLE 0.75 % VA GEL: VAGINAL | 6 refills | Status: DC

## 2016-04-13 NOTE — Progress Notes (Signed)
   Subjective:    Patient ID: Melissa [name], female    DOB: 1980-01-24, 36 y.o.   MRN: [id number]  HPI   Here for labs, but gives history of BV that has not completely resolved with 7 day oral Metronidazole Followed by 5 day treatment with vaginal gel treatment.  Still with white discharge and occasional itching.  Discussed recurrent BV and treatment as on wet prep still with BV.   To take Metronidazole 500 mg twice daily 7 day,  Followed by 6 months of twice weekly Metrogel. She will follow up in 3 months.    Review of Systems     Objective:   Physical Exam        Assessment & Plan:

## 2016-04-14 LAB — COMPREHENSIVE METABOLIC PANEL
ALBUMIN: 4.4 g/dL (ref 3.5–5.5)
ALT: 35 IU/L — ABNORMAL HIGH (ref 0–32)
AST: 26 IU/L (ref 0–40)
Albumin/Globulin Ratio: 1.5 (ref 1.2–2.2)
Alkaline Phosphatase: 91 IU/L (ref 39–117)
BILIRUBIN TOTAL: 0.3 mg/dL (ref 0.0–1.2)
BUN / CREAT RATIO: 20 (ref 9–23)
BUN: 12 mg/dL (ref 6–20)
CALCIUM: 9 mg/dL (ref 8.7–10.2)
CO2: 23 mmol/L (ref 18–29)
CREATININE: 0.61 mg/dL (ref 0.57–1.00)
Chloride: 102 mmol/L (ref 96–106)
GFR, EST AFRICAN AMERICAN: 135 mL/min/{1.73_m2} (ref >59)
GFR, EST NON AFRICAN AMERICAN: 117 mL/min/{1.73_m2} (ref >59)
GLUCOSE: 108 mg/dL — AB (ref 65–99)
Globulin, Total: 2.9 g/dL (ref 1.5–4.5)
Potassium: 4.3 mmol/L (ref 3.5–5.2)
Sodium: 142 mmol/L (ref 134–144)
TOTAL PROTEIN: 7.3 g/dL (ref 6.0–8.5)

## 2016-04-14 LAB — CBC WITH DIFFERENTIAL/PLATELET
BASOS: 0 %
Basophils Absolute: 0 10*3/uL (ref 0.0–0.2)
EOS (ABSOLUTE): 0.1 10*3/uL (ref 0.0–0.4)
EOS: 1 %
HEMATOCRIT: 44.3 % (ref 34.0–46.6)
Hemoglobin: 15.2 g/dL (ref 11.1–15.9)
Immature Grans (Abs): 0 10*3/uL (ref 0.0–0.1)
Immature Granulocytes: 0 %
LYMPHS ABS: 2.3 10*3/uL (ref 0.7–3.1)
Lymphs: 27 %
MCH: 31.1 pg (ref 26.6–33.0)
MCHC: 34.3 g/dL (ref 31.5–35.7)
MCV: 91 fL (ref 79–97)
MONOCYTES: 5 %
Monocytes Absolute: 0.4 10*3/uL (ref 0.1–0.9)
NEUTROS ABS: 5.7 10*3/uL (ref 1.4–7.0)
Neutrophils: 67 %
PLATELETS: 277 10*3/uL (ref 150–379)
RBC: 4.89 x10E6/uL (ref 3.77–5.28)
RDW: 13.1 % (ref 12.3–15.4)
WBC: 8.6 10*3/uL (ref 3.4–10.8)

## 2016-04-14 LAB — LIPID PANEL W/O CHOL/HDL RATIO
Cholesterol, Total: 191 mg/dL (ref 100–199)
HDL: 52 mg/dL (ref >39)
LDL Calculated: 117 mg/dL — ABNORMAL HIGH (ref 0–99)
Triglycerides: 111 mg/dL (ref 0–149)
VLDL Cholesterol Cal: 22 mg/dL (ref 5–40)

## 2016-07-29 ENCOUNTER — Encounter: Payer: Self-pay | Admitting: Internal Medicine

## 2016-07-29 ENCOUNTER — Ambulatory Visit (INDEPENDENT_AMBULATORY_CARE_PROVIDER_SITE_OTHER): Payer: Self-pay | Admitting: Internal Medicine

## 2016-07-29 VITALS — BP 122/82 | HR 60 | Resp 12 | Ht 59.0 in | Wt 156.0 lb

## 2016-07-29 DIAGNOSIS — B3731 Acute candidiasis of vulva and vagina: Secondary | ICD-10-CM

## 2016-07-29 DIAGNOSIS — N760 Acute vaginitis: Secondary | ICD-10-CM

## 2016-07-29 DIAGNOSIS — B9689 Other specified bacterial agents as the cause of diseases classified elsewhere: Secondary | ICD-10-CM

## 2016-07-29 DIAGNOSIS — B373 Candidiasis of vulva and vagina: Secondary | ICD-10-CM

## 2016-07-29 LAB — POCT WET PREP WITH KOH
KOH PREP POC: NEGATIVE
RBC Wet Prep HPF POC: NEGATIVE
TRICHOMONAS UA: NEGATIVE

## 2016-07-29 MED ORDER — ETONOGESTREL 68 MG ~~LOC~~ IMPL: 1.0000 | DRUG_IMPLANT | Freq: Once | SUBCUTANEOUS | Status: DC

## 2016-07-29 MED ORDER — FLUCONAZOLE 150 MG PO TABS: 150.0000 mg | ORAL_TABLET | Freq: Once | ORAL | 0 refills | Status: DC

## 2016-07-29 MED ORDER — FLUCONAZOLE 150 MG PO TABS: 150.0000 mg | ORAL_TABLET | Freq: Once | ORAL | 0 refills | Status: AC

## 2016-07-29 NOTE — Patient Instructions (Signed)
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

## 2016-07-29 NOTE — Addendum Note (Signed)
Addended by: Marcelino Duster on: 07/29/2016 10:36 AM   Modules accepted: Orders

## 2016-07-29 NOTE — Progress Notes (Signed)
   Subjective:    Patient ID: Melissa [name], female    DOB: 1980-05-07, 36 y.o.   MRN: [id number]  HPI   1.  Bacterial Vaginosis:  Took oral Metronidazole twice daily for 7 days, followed by twice weekly with Metronidazole vaginal gel.  Sounds like she used the gel appropriately, but stopped after 2 months.  Basically, around July 2.  She states she did not realize she was supposed to continue for 6 months and also then states the pharmacy told her she needed to return to see me before any refills. Did have less vaginal discharge with use.   Her discharge is the same currently as was during treatment and better than before treatment.  No odor. Pharmacy contacted--patient never filled the prescription for the gel as too expensive Discussed with patient--now shares she obtained 2 months from [place].  She apparently was seen at the family planning clinic there and was prescribed the gel.  Called [facility] pharmacy--she apparently was seen in February at the family planning clinic and given 2 tubes of Metronidazole vaginal gel then, but never used.  She utilized this for about 1.5 months after the oral Metronidazole course prescribed with [place] in May.     Current Meds  Medication Sig  . etonogestrel (NEXPLANON) 68 MG IMPL implant 1 each by Subdermal route once.    No Known Allergies     Review of Systems     Objective:   Physical Exam NAD GU:  Normal external genitalia.  Thick creamy white discharge with no cervical or vaginal wall inflammation.  No CMT.     Wet prep:  + clue cells, many bacteria, + Whiff, neg trich. Perhaps some budding yeast--difficult to see.    Assessment & Plan:  BV:  Not clear if recurrent or unresponsive to treatment as patient did not continue medication as planned due to cost.  Encouraged to notify clinic in future if unable to obtain medication prescribed for whatever reason and to be clear where she is getting medication so we can be clear what she  has done. Have spoken with [facility] pharm:  She can go to [facility] or [facility] and obtain the medication for BV treatment and suppression for 6 months for much less cost through their clinic. Discussed I cannot get that medicine for her inexpensively.   Will treat with a dose of Fluconazole for possible mild yeast infection.

## 2017-03-24 ENCOUNTER — Other Ambulatory Visit: Payer: Self-pay

## 2017-03-24 ENCOUNTER — Emergency Department (HOSPITAL_COMMUNITY): Payer: Self-pay

## 2017-03-24 ENCOUNTER — Encounter (HOSPITAL_COMMUNITY): Payer: Self-pay | Admitting: Emergency Medicine

## 2017-03-24 ENCOUNTER — Emergency Department (HOSPITAL_COMMUNITY)
Admission: EM | Admit: 2017-03-24 | Discharge: 2017-03-24 | Disposition: A | Discharge status: Home / Self Care | Payer: Self-pay | Attending: Emergency Medicine | Admitting: Emergency Medicine

## 2017-03-24 DIAGNOSIS — S39012A Strain of muscle, fascia and tendon of lower back, initial encounter: Secondary | ICD-10-CM | POA: Insufficient documentation

## 2017-03-24 DIAGNOSIS — Y929 Unspecified place or not applicable: Secondary | ICD-10-CM | POA: Insufficient documentation

## 2017-03-24 DIAGNOSIS — S060X0A Concussion without loss of consciousness, initial encounter: Secondary | ICD-10-CM | POA: Insufficient documentation

## 2017-03-24 DIAGNOSIS — Y939 Activity, unspecified: Secondary | ICD-10-CM | POA: Insufficient documentation

## 2017-03-24 DIAGNOSIS — Y999 Unspecified external cause status: Secondary | ICD-10-CM | POA: Insufficient documentation

## 2017-03-24 DIAGNOSIS — M542 Cervicalgia: Secondary | ICD-10-CM | POA: Insufficient documentation

## 2017-03-24 LAB — POC URINE PREG, ED: PREG TEST UR: NEGATIVE

## 2017-03-24 MED ORDER — NAPROXEN 375 MG PO TABS: 375.0000 mg | ORAL_TABLET | Freq: Two times a day (BID) | ORAL | 0 refills | Status: DC

## 2017-03-24 MED ORDER — ACETAMINOPHEN 325 MG PO TABS
650.0000 mg | ORAL_TABLET | Freq: Once | ORAL | Status: AC
  Administered 2017-03-24: 650 mg via ORAL
  Filled 2017-03-24: qty 2

## 2017-03-24 MED ORDER — METHOCARBAMOL 500 MG PO TABS: 500.0000 mg | ORAL_TABLET | Freq: Two times a day (BID) | ORAL | 0 refills | Status: DC

## 2017-03-24 MED ORDER — METHOCARBAMOL 500 MG PO TABS
500.0000 mg | ORAL_TABLET | Freq: Once | ORAL | Status: AC
  Administered 2017-03-24: 500 mg via ORAL
  Filled 2017-03-24: qty 1
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
## 2017-03-24 NOTE — ED Notes (Signed)
Bed: WTR5 Expected date:  Expected time:  Means of arrival:  Comments: 

## 2017-03-24 NOTE — ED Notes (Signed)
Pt ambulated to radiology
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
## 2017-03-25 ENCOUNTER — Emergency Department (HOSPITAL_COMMUNITY)
Admission: EM | Admit: 2017-03-25 | Discharge: 2017-03-25 | Disposition: A | Discharge status: Home / Self Care | Payer: Self-pay | Attending: Emergency Medicine | Admitting: Emergency Medicine

## 2017-03-25 ENCOUNTER — Other Ambulatory Visit: Payer: Self-pay

## 2017-03-25 ENCOUNTER — Encounter (HOSPITAL_COMMUNITY): Payer: Self-pay

## 2017-03-25 DIAGNOSIS — S161XXD Strain of muscle, fascia and tendon at neck level, subsequent encounter: Secondary | ICD-10-CM | POA: Insufficient documentation

## 2017-03-25 MED ORDER — OXYCODONE-ACETAMINOPHEN 5-325 MG PO TABS
1.0000 | ORAL_TABLET | Freq: Once | ORAL | Status: AC
  Administered 2017-03-25: 1 via ORAL
  Filled 2017-03-25: qty 1

## 2017-03-25 MED ORDER — KETOROLAC TROMETHAMINE 30 MG/ML IJ SOLN
30.0000 mg | Freq: Once | INTRAMUSCULAR | Status: AC
  Administered 2017-03-25: 30 mg via INTRAMUSCULAR
  Filled 2017-03-25: qty 1

## 2017-03-25 MED ORDER — PREDNISONE 20 MG PO TABS
60.0000 mg | ORAL_TABLET | Freq: Once | ORAL | Status: AC
  Administered 2017-03-25: 60 mg via ORAL
  Filled 2017-03-25: qty 3

## 2017-03-25 MED ORDER — DICLOFENAC SODIUM 50 MG PO TBEC: 50.0000 mg | DELAYED_RELEASE_TABLET | Freq: Two times a day (BID) | ORAL | 0 refills | Status: DC

## 2017-03-25 MED ORDER — PREDNISONE 10 MG PO TABS: 20.0000 mg | ORAL_TABLET | Freq: Two times a day (BID) | ORAL | 0 refills | Status: DC
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
## 2017-03-25 NOTE — Discharge Instructions (Addendum)
Stop the Naprosyn, start the prednisone tomorrow. Continue the muscle relaxer.

## 2018-10-17 ENCOUNTER — Other Ambulatory Visit (HOSPITAL_COMMUNITY): Payer: Self-pay | Admitting: *Deleted

## 2018-10-17 DIAGNOSIS — N644 Mastodynia: Secondary | ICD-10-CM

## 2018-11-23 ENCOUNTER — Ambulatory Visit: Payer: Self-pay

## 2018-11-23 ENCOUNTER — Ambulatory Visit (HOSPITAL_COMMUNITY)
Admission: RE | Admit: 2018-11-23 | Discharge: 2018-11-23 | Disposition: A | Discharge status: Home / Self Care | Payer: Self-pay | Source: Ambulatory Visit | Attending: Obstetrics and Gynecology | Admitting: Obstetrics and Gynecology

## 2018-11-23 ENCOUNTER — Encounter (HOSPITAL_COMMUNITY): Payer: Self-pay

## 2018-11-23 ENCOUNTER — Other Ambulatory Visit: Payer: Self-pay

## 2018-11-23 DIAGNOSIS — Z1239 Encounter for other screening for malignant neoplasm of breast: Secondary | ICD-10-CM | POA: Insufficient documentation

## 2018-11-23 NOTE — Patient Instructions (Signed)
Explained breast self awareness with [name]. Patient did not need a Pap smear today due to last Pap smear was 02/02/2016. Let her know BCCCP will cover Pap smears every 3 years unless has a history of abnormal Pap smears. Reminded patient that her next Pap smear is due early February 2021. Screening mammogram recommended at age 38 unless clinically indicated prior.  Shon Mansouri, [name], RN 8:43 AM

## 2018-11-23 NOTE — Progress Notes (Signed)
Complaints of left breast pain x one month that resolved two months ago.  Pap Smear: Pap smear not completed today. Last Pap smear was 02/02/2016 at the [facility] Department and normal. Per patient has no history of an abnormal Pap smear. Last Pap smear result will be scanned into Epic.  Physical exam: Breasts Breasts symmetrical. No skin abnormalities bilateral breasts. No nipple retraction bilateral breasts. No nipple discharge bilateral breasts. No lymphadenopathy. No lumps palpated bilateral breasts. No complaints of pain or tenderness on exam. Screening mammogram at age 38 unless clinically indicated prior.      Pelvic/Bimanual No Pap smear completed today since last Pap smear was 02/02/2016. Pap smear not indicated per BCCCP guidelines.   Smoking History: Patient has never smoked.  Patient Navigation: Patient education provided. Access to services provided for patient through [facility] program. Spanish interpreter provided.   Breast and Cervical Cancer Risk Assessment: Patient has no family history of breast cancer, known genetic mutations, or radiation treatment to the chest before age 65. Patient has no history of cervical dysplasia, immunocompromised, or DES exposure in-utero.  Risk Assessment    Risk Scores      11/23/2018   Last edited by: Loletta Parish, RN   5-year risk: 0.2 %   Lifetime risk: 4.7 %         Used Spanish interpreter Rudene Anda from [place].

## 2019-02-07 ENCOUNTER — Telehealth (HOSPITAL_COMMUNITY): Payer: Self-pay | Admitting: *Deleted

## 2019-02-07 NOTE — Telephone Encounter (Signed)
Called patient with Spanish interpreter Benjamine Sprague to let her know she is due for a Pap smear. Patient scheduled appointment to come to the free cervical cancer screening on 05/28/2019. Patient given appointment details. Patient verbalized understanding.

## 2019-05-28 ENCOUNTER — Other Ambulatory Visit: Payer: Self-pay

## 2019-05-28 ENCOUNTER — Other Ambulatory Visit: Payer: Self-pay | Admitting: *Deleted

## 2019-05-28 DIAGNOSIS — Z124 Encounter for screening for malignant neoplasm of cervix: Secondary | ICD-10-CM

## 2019-05-28 NOTE — Progress Notes (Signed)
Patient: Melissa [name]           Date of Birth: [birth year]           MRN: [id number] Visit Date: 05/28/2019 PCP: Mack Hook, MD  Cervical Cancer Screening Do you smoke?: No Have you ever had or been told you have an allergy to latex products?: No Marital status: Single Date of last pap smear: 2-5 yrs ago Date of last menstrual period: 04/05/19 Number of pregnancies: 4 Number of births: 4 Have you ever had any of the following? Hysterectomy: No Tubal ligation (tubes tied): No Abnormal bleeding: No Abnormal pap smear: No Venereal warts: No A sex partner with venereal warts: No A high risk* sex partner: No  Cervical Exam Pap smear completed today. Last Pap smear was 02/02/2016 at the [facility] Department and normal. Per patient has no history of an abnormal Pap smear. Last Pap smear result will be scanned into Epic.  Cervix reddened around the os and friable.  Patient's History Patient Active Problem List   Diagnosis Date Noted  . Screening breast examination 11/23/2018  . Right carpal tunnel syndrome 08/06/2015  . Lateral epicondylitis of right elbow 08/06/2015  . Colitis 07/18/2015  . PREMATURE ATRIAL CONTRACTIONS 08/12/2009   Past Medical History:  Diagnosis Date  . Colitis    involving the cecum and right colon/notes 07/18/2015  . DM (diabetes mellitus), gestational, antepartum 29    Family History  Problem Relation Age of Onset  . Diabetes Father   . Hypertension Father   . Hypertension Sister   . Other Neg Hx     Social History   Occupational History  . Occupation: [employer]  Tobacco Use  . Smoking status: Never Smoker  . Smokeless tobacco: Never Used  Substance and Sexual Activity  . Alcohol use: No    Alcohol/week: 0.0 standard drinks  . Drug use: No  . Sexual activity: Yes    Partners: Male    Birth control/protection: Implant   Used Spanish interpreter Rudene Anda from [place].

## 2019-05-28 NOTE — Progress Notes (Addendum)
Patient: Melissa [name]           Date of Birth: [birth year]           MRN: [id number] Visit Date: 05/28/2019 PCP: Mack Hook, MD  Cervical Cancer Screening Do you smoke?: No Have you ever had or been told you have an allergy to latex products?: No Marital status: Single Date of last pap smear: 2-5 yrs ago Date of last menstrual period: 04/05/19 Number of pregnancies: 4 Number of births: 4 Have you ever had any of the following? Hysterectomy: No Tubal ligation (tubes tied): No Abnormal bleeding: No Abnormal pap smear: No Venereal warts: No A sex partner with venereal warts: No A high risk* sex partner: No  Cervical Exam No complaints today. Pap smear not completed today. Last Pap smear was 02/02/2016 at the [facility] Department and normal. Per patient has no history of an abnormal Pap smear. Last Pap smear result will be scanned into Epic.  Normal Exam. If today's Pap smear is normal next Pap smear is due in 3 years.  Patient's History Patient Active Problem List   Diagnosis Date Noted  . Screening breast examination 11/23/2018  . Right carpal tunnel syndrome 08/06/2015  . Lateral epicondylitis of right elbow 08/06/2015  . Colitis 07/18/2015  . PREMATURE ATRIAL CONTRACTIONS 08/12/2009   Past Medical History:  Diagnosis Date  . Colitis    involving the cecum and right colon/notes 07/18/2015  . DM (diabetes mellitus), gestational, antepartum 51    Family History  Problem Relation Age of Onset  . Diabetes Father   . Hypertension Father   . Hypertension Sister   . Other Neg Hx     Social History   Occupational History  . Occupation: [employer]  Tobacco Use  . Smoking status: Never Smoker  . Smokeless tobacco: Never Used  Substance and Sexual Activity  . Alcohol use: No    Alcohol/week: 0.0 standard drinks  . Drug use: No  . Sexual activity: Yes    Partners: Male    Birth control/protection: Implant   Used Spanish interpreter  Rudene Anda from [place].

## 2019-05-29 LAB — CYTOLOGY - PAP: Diagnosis: NEGATIVE

## 2019-06-01 ENCOUNTER — Telehealth: Payer: Self-pay

## 2019-06-01 NOTE — Telephone Encounter (Signed)
Via Lavon Paganini, Spanish Interpreter, Patient informed negative pap results. Patient verbalized understanding.

## 2020-07-09 ENCOUNTER — Other Ambulatory Visit: Payer: Self-pay

## 2020-07-09 DIAGNOSIS — N644 Mastodynia: Secondary | ICD-10-CM

## 2020-07-10 NOTE — Addendum Note (Signed)
Addended by: Demetrius Revel on: 07/10/2020 03:36 PM   Modules accepted: Orders

## 2020-07-22 ENCOUNTER — Ambulatory Visit
Admission: RE | Admit: 2020-07-22 | Discharge: 2020-07-22 | Disposition: A | Discharge status: Home / Self Care | Payer: No Typology Code available for payment source | Source: Ambulatory Visit | Attending: Obstetrics and Gynecology | Admitting: Obstetrics and Gynecology

## 2020-07-22 ENCOUNTER — Ambulatory Visit: Payer: No Typology Code available for payment source | Admitting: *Deleted

## 2020-07-22 ENCOUNTER — Other Ambulatory Visit: Payer: Self-pay

## 2020-07-22 VITALS — BP 122/82 | Wt 158.9 lb

## 2020-07-22 DIAGNOSIS — N644 Mastodynia: Secondary | ICD-10-CM

## 2020-07-22 DIAGNOSIS — Z1239 Encounter for other screening for malignant neoplasm of breast: Secondary | ICD-10-CM

## 2020-07-22 NOTE — Progress Notes (Signed)
Ms. Boots Mcglown is a 40 y.o. female who presents to [facility] clinic today with complaint of left breast pain. Patient states her left outer breast is painful when touched since April 2022. Patient rates the pain at a 3 out of 10.    Pap Smear: Pap smear not completed today. Last Pap smear was 05/28/2019 at the free cervical cancer screening clinic and was normal. Per patient has no history of an abnormal Pap smear. Last Pap smear result is available in Epic.   Physical exam: Breasts Breasts symmetrical. No skin abnormalities bilateral breasts. No nipple retraction bilateral breasts. No nipple discharge bilateral breasts. No lymphadenopathy. No lumps palpated bilateral breasts. Complaints of left outer breast pain at 3 o'clock on exam.       Pelvic/Bimanual Pap is not indicated today per BCCCP guidelines.   Smoking History: Patient has never smoked.   Patient Navigation: Patient education provided. Access to services provided for patient through [facility] program. Spanish interpreter Rudene Anda from [facility] provided.    Breast and Cervical Cancer Risk Assessment: Patient does not have family history of breast cancer, known genetic mutations, or radiation treatment to the chest before age 26. Patient does not have history of cervical dysplasia, immunocompromised, or DES exposure in-utero.  Risk Assessment     Risk Scores       07/22/2020 11/23/2018   Last edited by: Royston Bake, CMA Christino Mcglinchey, Heath Gold, RN   5-year risk: 0.2 % 0.2 %   Lifetime risk: 4.7 % 4.7 %            A: BCCCP exam without pap smear Complaints of left breast pain.  P: Referred patient to the [facility] for a diagnostic mammogram. Appointment scheduled Tuesday, July 22, 2020 at 1320.  Loletta Parish, RN 07/22/2020 11:42 AM

## 2020-07-22 NOTE — Patient Instructions (Signed)
Explained breast self awareness with [name]. Patient did not need a Pap smear today due to last Pap smear was 05/28/2019 Let her know BCCCP will cover Pap smears every 3 years unless has a history of abnormal Pap smears. Referred patient to the [facility] for a diagnostic mammogram. Appointment scheduled Tuesday, July 22, 2020 at 1320. Patient aware of appointment and will be there.  Melissa [name] verbalized understanding.  [name], [name], RN 11:42 AM

## 2021-05-26 ENCOUNTER — Other Ambulatory Visit: Payer: Self-pay | Admitting: Obstetrics & Gynecology

## 2021-05-26 DIAGNOSIS — Z1231 Encounter for screening mammogram for malignant neoplasm of breast: Secondary | ICD-10-CM

## 2021-06-24 ENCOUNTER — Inpatient Hospital Stay: Payer: Self-pay | Attending: Obstetrics and Gynecology | Admitting: *Deleted

## 2021-06-24 ENCOUNTER — Other Ambulatory Visit: Payer: Self-pay

## 2021-06-24 VITALS — BP 140/100 | Ht 59.0 in | Wt 152.7 lb

## 2021-06-24 DIAGNOSIS — Z0000 Encounter for general adult medical examination without abnormal findings: Secondary | ICD-10-CM

## 2021-06-24 NOTE — Progress Notes (Signed)
Melissa [name], [employer]    Clinical Measurement: There were no vitals filed for this visit. Fasting Labs Drawn Today, will review with patient when they result.   Medical History:  Patient states that she does not have high cholesterol, does not have high blood pressure and she does not have diabetes.  Medications:  Patient states that she does not take medication to lower cholesterol, blood pressure or blood sugar.  Patient does not take an aspirin a day to help prevent a heart attack or stroke.    Blood pressure, self measurement: Patient states that she does not measure blood pressure from home. She checks her blood pressure N/A. She shares her readings with a health care provider: N/A.   Nutrition: Patient states that on average she eats 0 cups of fruit and 2 cups of vegetables per day. Patient states that she does not eat fish at least 2 times per week. Patient eats less than half servings of whole grains. Patient drinks less than 36 ounces of beverages with added sugar weekly: yes. Patient is currently watching sodium or salt intake: yes. In the past 7 days patient has consumed drinks containing alcohol on 0 days. On a day that patient consumes drinks containing alcohol on average 0 drinks are consumed.      Physical activity:  Patient states that she gets 140 minutes of moderate and 240 minutes of vigorous physical activity each week.  Smoking status:  Patient states that she has has never smoked .   Quality of life:  Over the past 2 weeks patient states that she had little interest or pleasure in doing things: not at all. She has been feeling down, depressed or hopeless:not at all.    Risk reduction and counseling:   Health Coaching: Spoke with patient about the daily recommendation for fruits and vegetables (2 cups of fruit and 3 cups of vegetables). Patient does not like fish. Gave suggestions for other ways that patient can get beneficial  omega-3's in diet (olive oil, avocados, almonds). Patient does not consume any whole grains. Gave suggestions for different whole grains that patient can try incorporating into diet (oatmeal, whole grain cereals, whole wheat pasta, brown rice or whole wheat bread). Patient does not consume beverages with added sugars. Encouraged patient to continue watching the amount of sodium consumed given elevated blood pressure. Patient has been exercising regularly. Patient has been walking daily for 20 minutes. Patient has been doing zumba classes 4 days a week for an hour. Encouraged patient to continue with current exercise routine.   Goal: Patient would like to work on improving diet and having a more well balanced diet. Patient would like to try and eat more foods from the different food groups and practice a heart healthy diet. Patient will work on making these changes with diet over the next 3 months.    Navigation:  I will notify patient of lab results.  Patient is aware of 2 more health coaching sessions and a follow up. Will refer patient to Internal Medicine for follow-up for elevated blood pressure.   Time: 25 minutes

## 2021-06-25 LAB — GLUCOSE, RANDOM: Glucose: 92 mg/dL (ref 70–99)

## 2021-06-25 LAB — LIPID PANEL
Cholesterol, Total: 191 mg/dL (ref 100–199)
VLDL Cholesterol Cal: 15 mg/dL (ref 5–40)

## 2021-06-25 LAB — HEMOGLOBIN A1C: Est. average glucose Bld gHb Est-mCnc: 117 mg/dL

## 2021-06-29 ENCOUNTER — Telehealth: Payer: Self-pay

## 2021-07-17 ENCOUNTER — Ambulatory Visit (INDEPENDENT_AMBULATORY_CARE_PROVIDER_SITE_OTHER): Payer: Self-pay | Admitting: Student

## 2021-07-17 ENCOUNTER — Encounter: Payer: Self-pay | Admitting: Student

## 2021-07-17 ENCOUNTER — Other Ambulatory Visit: Payer: Self-pay

## 2021-07-17 DIAGNOSIS — O24419 Gestational diabetes mellitus in pregnancy, unspecified control: Secondary | ICD-10-CM

## 2021-07-17 DIAGNOSIS — R7303 Prediabetes: Secondary | ICD-10-CM

## 2021-07-17 DIAGNOSIS — Z0000 Encounter for general adult medical examination without abnormal findings: Secondary | ICD-10-CM

## 2021-07-17 NOTE — Progress Notes (Signed)
CC: F/U for labs  HPI:  [name] is a 41 y.o. female living with a history stated below and presents today for a well woman visit, as well as a follow up for her labs. Please see problem based assessment and plan for additional details.  This patient was seen with interpreter named Jason Coop from [facility].  Past Medical History:  Diagnosis Date   Colitis    involving the cecum and right colon/notes 07/18/2015   DM (diabetes mellitus), gestational, antepartum 2015    Current Outpatient Medications on File Prior to Visit  Medication Sig Dispense Refill   diclofenac (VOLTAREN) 50 MG EC tablet Take 1 tablet (50 mg total) by mouth 2 (two) times daily. (Patient not taking: Reported on 06/24/2021) 15 tablet 0   etonogestrel (NEXPLANON) 68 MG IMPL implant 1 each (68 mg total) by Subdermal route once. 1 each    methocarbamol (ROBAXIN) 500 MG tablet Take 1 tablet (500 mg total) by mouth 2 (two) times daily. (Patient not taking: Reported on 07/22/2020) 20 tablet 0   predniSONE (DELTASONE) 10 MG tablet Take 2 tablets (20 mg total) by mouth 2 (two) times daily with a meal. (Patient not taking: Reported on 07/22/2020) 14 tablet 0   triamcinolone cream (KENALOG) 0.1 % Apply 1 application topically 2 (two) times daily. (Patient not taking: Reported on 12/22/2015) 80 g 0   UNKNOWN TO PATIENT BCP unsure of which type.     No current facility-administered medications on file prior to visit.    Family History  Problem Relation Age of Onset   Diabetes Father    Hypertension Father    Hypertension Sister    Other Neg Hx     Social History   Socioeconomic History   Marital status: Single    Spouse name: Esteban(boyfriend)   Number of children: 4   Years of education: 6    Highest education level: Not on file  Occupational History   Occupation: [employer]  Tobacco Use   Smoking status: Never   Smokeless tobacco: Never  Vaping Use   Vaping Use: Never used  Substance and  Sexual Activity   Alcohol use: No    Alcohol/week: 0.0 standard drinks of alcohol   Drug use: No   Sexual activity: Yes    Partners: Male    Birth control/protection: Pill  Other Topics Concern   Not on file  Social History Narrative   Originally from [place]   Came to [place] In 2003   Lives at home with her 4 children   Long term boyfriend--7 years.   Social Determinants of Health   Financial Resource Strain: Not on file  Food Insecurity: No Food Insecurity (06/24/2021)   Hunger Vital Sign    Worried About Running Out of Food in the Last Year: Never true    Ran Out of Food in the Last Year: Never true  Transportation Needs: No Transportation Needs (06/24/2021)   PRAPARE - Hydrologist (Medical): No    Lack of Transportation (Non-Medical): No  Physical Activity: Not on file  Stress: Not on file  Social Connections: Not on file  Intimate Partner Violence: Not on file    Review of Systems: ROS negative except for what is noted on the assessment and plan.  Vitals:   07/17/21 0841 07/17/21 0853  BP: 126/90 114/72  Pulse: 65 61  Temp: 98.4 F (36.9 C)   TempSrc: Oral   SpO2: 100%   Weight: 151  lb 12.8 oz (68.9 kg)   Height: 5' (1.524 m)     Physical Exam: Constitutional: well-appearing woman sitting comfortably in no acute distress HENT: normocephalic atraumatic, mucous membranes moist Eyes: conjunctiva non-erythematous Cardiovascular: regular rate and rhythm, no m/r/g Pulmonary/Chest: normal work of breathing on room air, lungs clear to auscultation bilaterally Skin: warm and dry   Assessment & Plan:   Prediabetes Patient's last A1c on June 24, 2021 was 5.7, and her glucose level at the time was 92.  She denies any polyuria, or polydipsia, or trouble seeing.  She denies any numbness or tingling in her feet or any part of her body.  She states that she has been working on her diet and has cut off a lot of carbohydrates from her diet, as  well as including portion control.  She is also started exercising with Zumba classes.  Plan:  1.  Continue current diet and exercise regimen 2.  Follow-up in a year.  Patient seen with Dr. Levie Heritage, M.D. [facility], PGY-1 Phone: [phone] Date 07/17/2021 Time 4:28 PM

## 2021-07-17 NOTE — Assessment & Plan Note (Signed)
Patient's last A1c on June 24, 2021 was 5.7, and her glucose level at the time was 92.  She denies any polyuria, or polydipsia, or trouble seeing.  She denies any numbness or tingling in her feet or any part of her body.  She states that she has been working on her diet and has cut off a lot of carbohydrates from her diet, as well as including portion control.  She is also started exercising with Zumba classes.  Plan:  1.  Continue current diet and exercise regimen 2.  Follow-up in a year.

## 2021-07-17 NOTE — Patient Instructions (Signed)
Thank you so much for coming to the clinic today!  We talked about a few things, so heres just a quick summary.  1. We talked about your cholesterol from your labs done on June 21 is doing very well, I would ask if you could get the blood pressure device and keep track of your blood pressure just for your own sake that would be great.   2.  We also talked about how you are in range for something called prediabetes, however because you are dieting and you are exercising it looks like you are moving in the right direction.  Keep it up!  If you have any questions or concerns, please call the clinic at [phone], thank you!  Dr. Marlene Lard Darrielle Pflieger    Muchas gracias por venir a la clnica hoy! Hablamos de algunas cosas, as que aqu hay solo un breve resumen.  1. Hablamos de que su nivel de colesterol de los anlisis de laboratorio realizados el 21 de junio est [employer]. Le preguntara si podra obtener el dispositivo de presin arterial y Optometrist un seguimiento de su presin arterial solo por su propio bien, eso sera genial.   2. Tambin hablamos sobre cmo se encuentra en el rango de algo llamado prediabetes, sin embargo, debido a que est haciendo dieta y haciendo ejercicio, parece que se est moviendo en la direccin correcta. Avanza!  Si tiene alguna pregunta o inquietud, llame a la clnica al [phone], gracias!  Dr. Marlene Lard Ariday Brinker

## 2021-07-22 NOTE — Progress Notes (Signed)
Internal Medicine Clinic Attending  I saw and evaluated the patient.  I personally confirmed the key portions of the history and exam documented by the resident  and I reviewed pertinent patient test results.  The assessment, diagnosis, and plan were formulated together and I agree with the documentation in the resident's note.  

## 2021-08-04 ENCOUNTER — Ambulatory Visit
Admission: RE | Admit: 2021-08-04 | Discharge: 2021-08-04 | Disposition: A | Discharge status: Home / Self Care | Payer: Self-pay | Source: Ambulatory Visit | Attending: Obstetrics & Gynecology | Admitting: Obstetrics & Gynecology

## 2021-08-04 DIAGNOSIS — Z1231 Encounter for screening mammogram for malignant neoplasm of breast: Secondary | ICD-10-CM

## 2021-09-28 ENCOUNTER — Telehealth: Payer: Self-pay

## 2021-09-28 NOTE — Telephone Encounter (Signed)
Health Coaching 3  interpreter- Rudene Anda, [employer]   Goals- Patient has been doing zumba classes 5 days a week for an hour. Patient has lost 10 pounds since her initial screening. Patient has cut out sugar and carbs from diet.    New goal- Maintenance of health changes that patient has made in relation to diet and exercise.   Barrier to reaching goal-    Strategies to overcome-    Navigation:  Patient is aware of  a follow up session on 11/23/21 @ 11:00 am.   Time- 10 minutes

## 2021-09-28 NOTE — Telephone Encounter (Signed)
Left message for patient about completing HC 3 for the Wise Woman program. Left name and number for patient to call back. 

## 2021-11-23 ENCOUNTER — Inpatient Hospital Stay: Payer: Self-pay | Attending: Obstetrics and Gynecology | Admitting: *Deleted

## 2021-11-23 VITALS — BP 110/74 | Ht 59.0 in | Wt 144.5 lb

## 2021-11-23 DIAGNOSIS — Z0000 Encounter for general adult medical examination without abnormal findings: Secondary | ICD-10-CM

## 2021-11-23 NOTE — Progress Notes (Signed)
Wisewoman follow up   Interpreter: Bland Span, [employer]  Clinical Measurement:  There were no vitals filed for this visit.    Medical History:  Patient states that she does not have high cholesterol, does not have high blood pressure and she does not have diabetes.  Medications:  Patient states that she does not take medication to lower cholesterol, blood pressure and blood sugar.  Patient does not take an aspirin a day to help prevent a heart attack or stroke.    Blood pressure, self measurement: Patient states that she does not measure blood pressure from home. She checks her blood pressure N/A. She shares her readings with a health care provider: N/A.   Nutrition: Patient states that on average she eats 2 cups of fruit and 2 cups of vegetables per day. Patient states that she does not eat fish at least 2 times per week. Patient eats less than half servings of whole grains. Patient drinks less than 36 ounces of beverages with added sugar weekly: yes. Patient is currently watching sodium or salt intake: yes. In the past 7 days patient has had 0 drinks containing alcohol. On average patient drinks 0 drinks containing alcohol per day.      Physical activity:  Patient states that she gets 150 minutes of moderate and 150 minutes of vigorous physical activity each week.  Smoking status:  Patient states that she has has never smoked .   Quality of life:  Over the past 2 weeks patient states that she had little interest or pleasure in doing things: not at all. She has been feeling down, depressed or hopeless:not at all.    Risk reduction and counseling:   Health Coaching: Spoke with patient about continuing to practice a low-fat diet and watching the amount of fried foods consumed. Patient has been consuming fish bi-weekly. Encouraged patient to try and add heart healthy fish into her weekly diet. Patient consumes oatmeal but not often. Gave suggestions of other whole grains that she can try adding into  diet (whole grain/wheat bread, whole wheat pasta, brown rice, oatmeal or whole grain cereals). Patient has eliminated beverages with added sugars from her diet. Patient has been doing zumba classes on average 5 days a week for an hour. Patient has lost 8 pounds since her initial screening. Encouraged patient to keep up the good work!   Navigation: This was the  follow up session for this patient, I will check up on her progress in the coming months.  Time: 20 minutes

## 2022-05-25 ENCOUNTER — Telehealth: Payer: Self-pay

## 2022-05-25 ENCOUNTER — Other Ambulatory Visit: Payer: Self-pay | Admitting: Obstetrics & Gynecology

## 2022-05-25 DIAGNOSIS — Z1231 Encounter for screening mammogram for malignant neoplasm of breast: Secondary | ICD-10-CM

## 2022-05-25 NOTE — Telephone Encounter (Addendum)
Telephoned patient using interpreter#[id number]. Left a voice message with [facility] (scholarship) contact information.  Returned patient's call using interpreter # [phone] and left a voice message.

## 2022-06-28 ENCOUNTER — Inpatient Hospital Stay: Payer: Self-pay | Attending: Obstetrics and Gynecology | Admitting: *Deleted

## 2022-06-28 ENCOUNTER — Other Ambulatory Visit: Payer: Self-pay

## 2022-06-28 VITALS — BP 122/82 | Ht 59.0 in | Wt 149.1 lb

## 2022-06-28 DIAGNOSIS — Z0000 Encounter for general adult medical examination without abnormal findings: Secondary | ICD-10-CM

## 2022-06-28 NOTE — Progress Notes (Signed)
Wisewoman initial screening   Interpreter- Natale Lay, [employer]   Clinical Measurement: There were no vitals filed for this visit. Fasting Labs Drawn Today, will review with patient when they result.   Medical History: Patient states that she does not have high cholesterol, does not know if she has high blood pressure and she does not know if she has diabetes.  Medications: Patient states that she does not take medication to lower cholesterol, blood pressure or blood sugar.  Patient does not take an aspirin a day to help prevent a heart attack or stroke.    Blood pressure, self measurement: Patient states that she does not have the equipment to measure blood pressure from home. She checks her blood pressure N/A. She shares her readings with a health care provider: N/A.   Nutrition: Patient states that on average she eats 2 cups of fruit and 3 cups of vegetables per day. Patient states that she does eat fish at least 2 times per week. Patient eats about half servings of whole grains. Patient drinks less than 36 ounces of beverages with added sugar weekly: yes. Patient is currently watching sodium or salt intake: yes. In the past 7 days patient has consumed drinks containing alcohol on 0 days. On a day that patient consumes drinks containing alcohol on average 0 drinks are consumed.      Physical activity: Patient states that she gets 150 minutes of moderate and 150 minutes of vigorous physical activity each week.  Smoking status: Patient states that she has has never smoked .   Quality of life: Over the past 2 weeks patient states that she had little interest or pleasure in doing things: not at all. She has been feeling down, depressed or hopeless:not at all.   Social Determinants of Health Assessment:   Computer Use: During the last 12 months patient states that she has used any of the following: desktop/laptop, smart phone or tablet/other portable wireless computer: yes.   Internet Use:  During the last 12 months, did you or any member of your household have access to the internet: Yes, by paying a cell phone company or internet service provider.   Food Insecurities: During the last 12 months, where there any times when you were worried that you would run out of food because of a lack of money or other resources: No.   Transportation Barriers: During the last 12 months, have you missed a doctor's appointment because of transportation problems: No.   Childcare Barriers: If you are currently using childcare services, please identify  the type of services you use. (If not using childcare services, please select "Not applicable"): not applicable. During the last 12 months, have you had any barriers to childcare services such as: not applicable.   Housing: What is your housing situation today: I have housing.   Intimate Partner Violence: During the last 12 months, how often did your partner physically hurt you: never. During the last 12 months, how often did your partner insult you or talk down to you: never.  Medication Adherence: During the last 12 months, did you ever forget to take your medicine: not applicable. During the last 12 months, were you careless ar times about taking your medicine: not applicable. During the last 12 months, when you felt better did you sometimes stop taking your medication: not applicable. During the last 12 months, sometimes if you felt worse when you took your medicine did you stop taking it: not applicable.   Risk reduction and  counseling:   Health Coaching: Reviewed heart healthy diet recommendations with patient. Patient has a Systems analyst that she has been working with. She sees her 6 days a week for an hour.  Goal: Patient did not set a SMART goal. Patient is happy with the changes that she has made over the past year and is trying to maintain those changes.    Navigation:  I will notify patient of lab results.  Patient is aware of 2 more  health coaching sessions and a follow up.  Time: 20 minutes

## 2022-06-29 LAB — GLUCOSE, RANDOM: Glucose: 82 mg/dL (ref 70–99)

## 2022-06-29 LAB — LIPID PANEL
Chol/HDL Ratio: 3.1 ratio (ref 0.0–4.4)
Cholesterol, Total: 190 mg/dL (ref 100–199)
HDL: 62 mg/dL (ref >39)
LDL Chol Calc (NIH): 117 mg/dL — ABNORMAL HIGH (ref 0–99)
Triglycerides: 58 mg/dL (ref 0–149)
VLDL Cholesterol Cal: 11 mg/dL (ref 5–40)

## 2022-06-29 LAB — HEMOGLOBIN A1C
Est. average glucose Bld gHb Est-mCnc: 111 mg/dL
Hgb A1c MFr Bld: 5.5 % (ref 4.8–5.6)

## 2022-07-07 ENCOUNTER — Other Ambulatory Visit: Payer: Self-pay

## 2022-07-07 ENCOUNTER — Telehealth: Payer: Self-pay

## 2022-07-07 DIAGNOSIS — Z1231 Encounter for screening mammogram for malignant neoplasm of breast: Secondary | ICD-10-CM

## 2022-07-07 NOTE — Telephone Encounter (Signed)
Health coaching 2   interpreter- Natale Lay, [employer]   Labs- 190 cholesterol, 117 LDL cholesterol, 58 triglycerides, 62 HDL cholesterol, 5.5 hemoglobin A1C, 82 mean plasma glucose. Patient understands and is aware of her lab results.   Goals-  1. Reduce the amount of fried and fatty foods consumed. Try to grill, bake, broil or sautee foods instead.  2. Reduce the amount of red meat consumed. Substitute for lean protein like chicken or fish. Reduce the amount of shrimp consumed. Per patient she consumes a lot of shrimp. 3. Continue with daily exercise for 20-30 minutes.   Navigation:  Patient is aware of 1 more health coaching sessions and a follow up.   Time- 11 minutes

## 2022-07-20 ENCOUNTER — Encounter: Payer: Self-pay | Admitting: Student

## 2022-08-26 ENCOUNTER — Ambulatory Visit: Payer: Self-pay | Admitting: Hematology and Oncology

## 2022-08-26 ENCOUNTER — Ambulatory Visit
Admission: RE | Admit: 2022-08-26 | Discharge: 2022-08-26 | Disposition: A | Discharge status: Home / Self Care | Payer: No Typology Code available for payment source | Source: Ambulatory Visit | Attending: Obstetrics & Gynecology | Admitting: Obstetrics & Gynecology

## 2022-08-26 VITALS — Wt 153.0 lb

## 2022-08-26 DIAGNOSIS — Z01419 Encounter for gynecological examination (general) (routine) without abnormal findings: Secondary | ICD-10-CM

## 2022-08-26 DIAGNOSIS — Z1231 Encounter for screening mammogram for malignant neoplasm of breast: Secondary | ICD-10-CM

## 2022-08-26 NOTE — Progress Notes (Signed)
Melissa [name] is a 42 y.o. female who presents to [facility] clinic today with no complaints.    Pap Smear: Pap not smear completed today. Last Pap smear was 05/28/19 and was normal. Per patient has no history of an abnormal Pap smear. Last Pap smear result is available in Epic.   Physical exam: Breasts Breasts symmetrical. No skin abnormalities bilateral breasts. No nipple retraction bilateral breasts. No nipple discharge bilateral breasts. No lymphadenopathy. No lumps palpated bilateral breasts.   MS DIGITAL SCREENING TOMO BILATERAL  Result Date: 08/06/2021 CLINICAL DATA:  Screening. EXAM: DIGITAL SCREENING BILATERAL MAMMOGRAM WITH TOMOSYNTHESIS AND CAD TECHNIQUE: Bilateral screening digital craniocaudal and mediolateral oblique mammograms were obtained. Bilateral screening digital breast tomosynthesis was performed. The images were evaluated with computer-aided detection. COMPARISON:  Previous exam(s). ACR Breast Density Category c: The breast tissue is heterogeneously dense, which may obscure small masses. FINDINGS: There are no findings suspicious for malignancy. IMPRESSION: No mammographic evidence of malignancy. A result letter of this screening mammogram will be mailed directly to the patient. RECOMMENDATION: Screening mammogram in one year. (Code:SM-B-01Y) BI-RADS CATEGORY  1: Negative. Electronically Signed   By: Frederico Hamman M.D.   On: 08/06/2021 11:24   MS DIGITAL DIAG TOMO BILAT  Result Date: 07/22/2020 CLINICAL DATA:  Patient reports focal tenderness in the UPPER-OUTER QUADRANT of the LEFT breast. Pain since April. EXAM: DIGITAL DIAGNOSTIC BILATERAL MAMMOGRAM WITH TOMOSYNTHESIS AND CAD; ULTRASOUND LEFT BREAST LIMITED TECHNIQUE: Bilateral digital diagnostic mammography and breast tomosynthesis was performed. The images were evaluated with computer-aided detection.; Targeted ultrasound examination of the left breast was performed COMPARISON:  None. ACR Breast Density Category c:  The breast tissue is heterogeneously dense, which may obscure small masses. FINDINGS: No suspicious mass, distortion, or microcalcifications are identified to suggest presence of malignancy. Spot tangential view in the UPPER-OUTER QUADRANT of the LEFT breast shows normal appearing fibroglandular tissue. Targeted ultrasound is performed, showing normal appearing fibroglandular tissue in the UPPER-OUTER QUADRANT of the LEFT breast. No suspicious mass, distortion, or acoustic shadowing is demonstrated with ultrasound. IMPRESSION: 1. No mammographic or ultrasound evidence for malignancy. 2. Breast pain is a common condition. It can be exacerbated by caffeine, hormonal changes, hormonal medications, and weight changes. It can be improved by wearing adequate support, over-the-counter pain medication, low-fat diet, exercise, and ice as needed. Studies have shown an improvement in cyclic pain with use of evening primrose oil and vitamin E. Often breast pain will resolve on its own without intervention. RECOMMENDATION: Screening mammogram in one year.(Code:SM-B-01Y) I have discussed the findings and recommendations with the patient with the assistance of an interpreter. If applicable, a reminder letter will be sent to the patient regarding the next appointment. BI-RADS CATEGORY  1: Negative. Electronically Signed   By: Norva Pavlov M.D.   On: 07/22/2020 14:31        Pelvic/Bimanual Pap is not indicated today    Smoking History: Patient has never smoked and was not referred to quit line.    Patient Navigation: Patient education provided. Access to services provided for patient through [facility] program. Melissa [name] interpreter provided. No transportation provided   Colorectal Cancer Screening: Per patient has never had colonoscopy completed No complaints today.    Breast and Cervical Cancer Risk Assessment: Patient does not have family history of breast cancer, known genetic mutations, or radiation  treatment to the chest before age 50. Patient does not have history of cervical dysplasia, immunocompromised, or DES exposure in-utero.  Risk Scores as of Encounter on  08/26/2022     Melissa [name]           5-year 0.43%   Lifetime 6.58%   This patient is [place]/Lat[place] but has no documented birth country, so the [place] model used data from [place] patients to calculate their risk score. Document a birth country in the Demographics activity for a more accurate score.         Last calculated by Melissa [name], CMA on 08/26/2022 at 11:30 AM        A: [facility] exam without pap smear No complaints with benign exam.  P: Referred patient to the [facility] for a screening mammogram. Appointment scheduled 08/26/22.  Pascal Lux, NP 08/26/2022 11:43 AM

## 2022-08-26 NOTE — Patient Instructions (Signed)
Taught Velna Hatchet about self breast awareness and gave educational materials to take home. Patient did not need a Pap smear today due to last Pap smear was in 05/25/2022 per patient.  Let her know [facility] will cover Pap smears every 5 years unless has a history of abnormal Pap smears. Referred patient to the [facility] for screening mammogram. Appointment scheduled for 08/26/22. Patient aware of appointment and will be there. Let patient know will follow up with her within the next couple weeks with results. Zanaiyah Gutierrez-Cruz verbalized understanding.  Pascal Lux, NP 11:45 AM

## 2022-09-01 LAB — CYTOLOGY - PAP
Adequacy: ABSENT
Comment: NEGATIVE
Diagnosis: NEGATIVE
High risk HPV: NEGATIVE

## 2022-12-25 IMAGING — MG DIGITAL DIAGNOSTIC BILAT W/ TOMO W/ CAD
6 of 12 series · 6 of 36 positions shown · non-contrast
Comparison: None.

CLINICAL DATA: Patient reports focal tenderness in the UPPER-OUTER
QUADRANT of the LEFT breast. Pain since Deanne.

EXAM:
DIGITAL DIAGNOSTIC BILATERAL MAMMOGRAM WITH TOMOSYNTHESIS AND CAD;
ULTRASOUND LEFT BREAST LIMITED
TECHNIQUE: Bilateral digital diagnostic mammography and breast tomosynthesis
was performed. The images were evaluated with computer-aided
detection.; Targeted ultrasound examination of the left breast was
performed

[L TAN synth-2D]
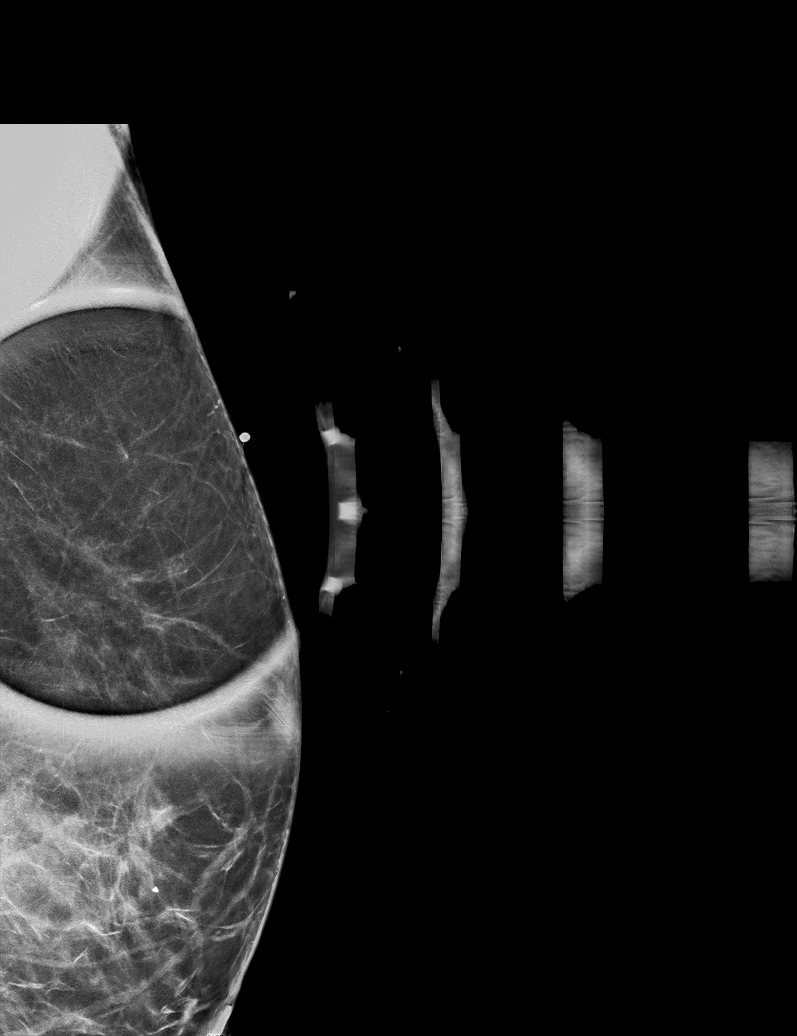

[L MLO synth-2D (1 of 2)]
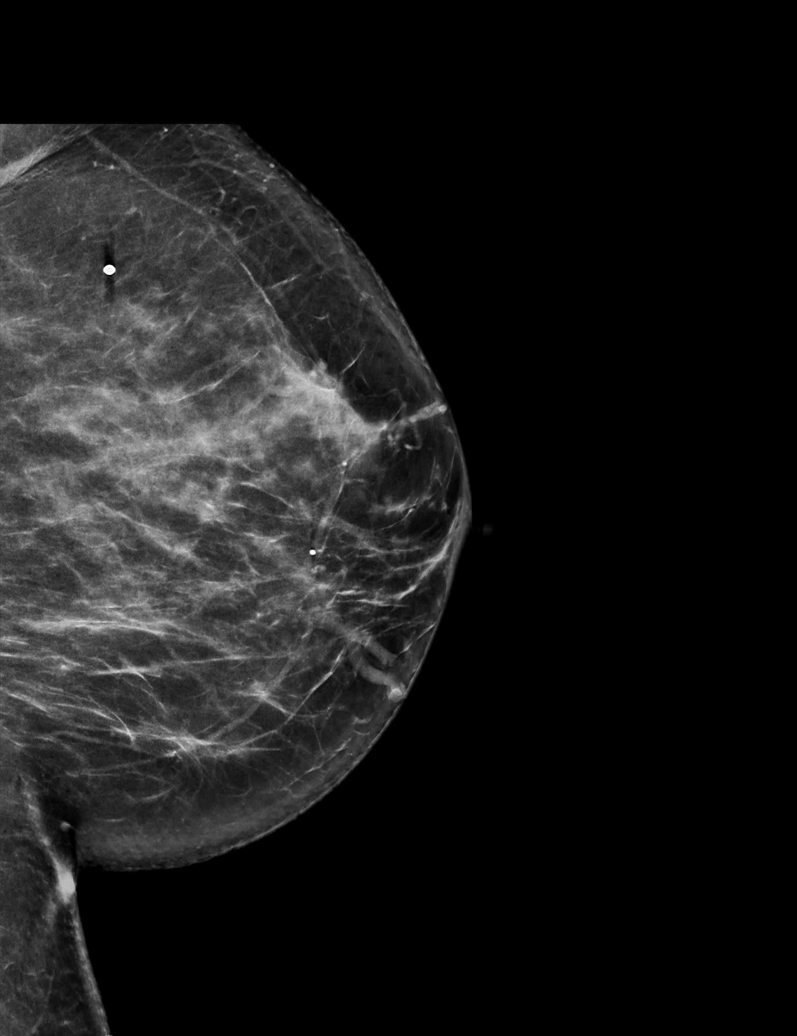

[L CC synth-2D]
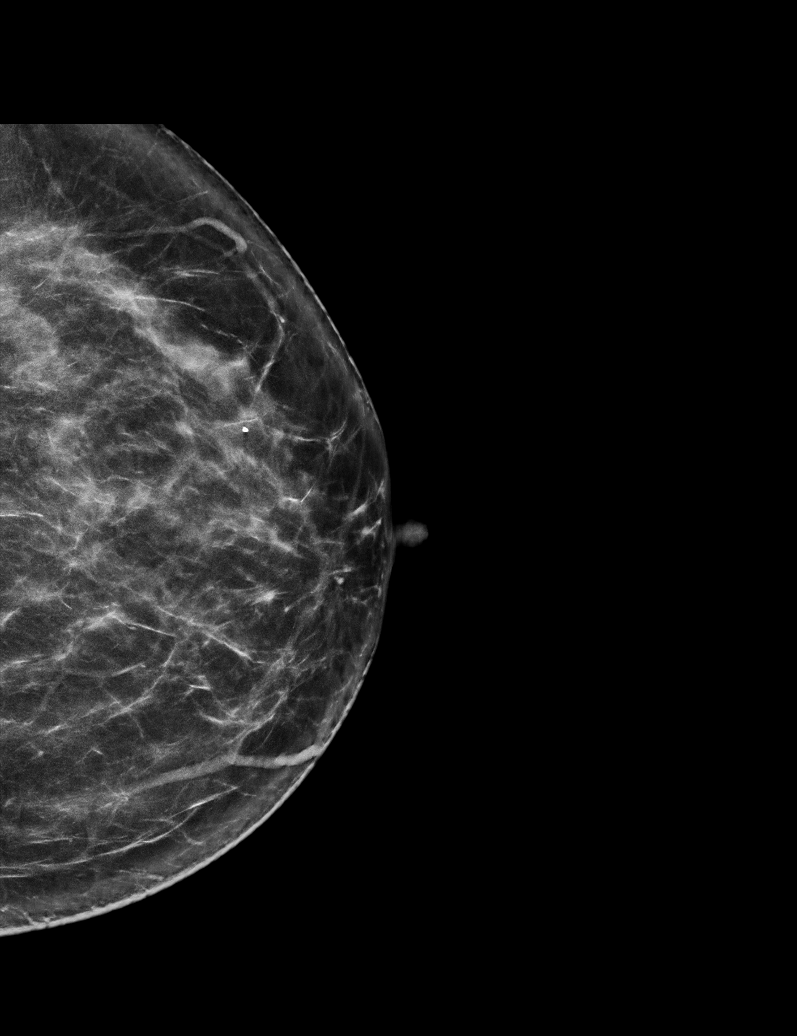

[L MLO synth-2D (2 of 2)]
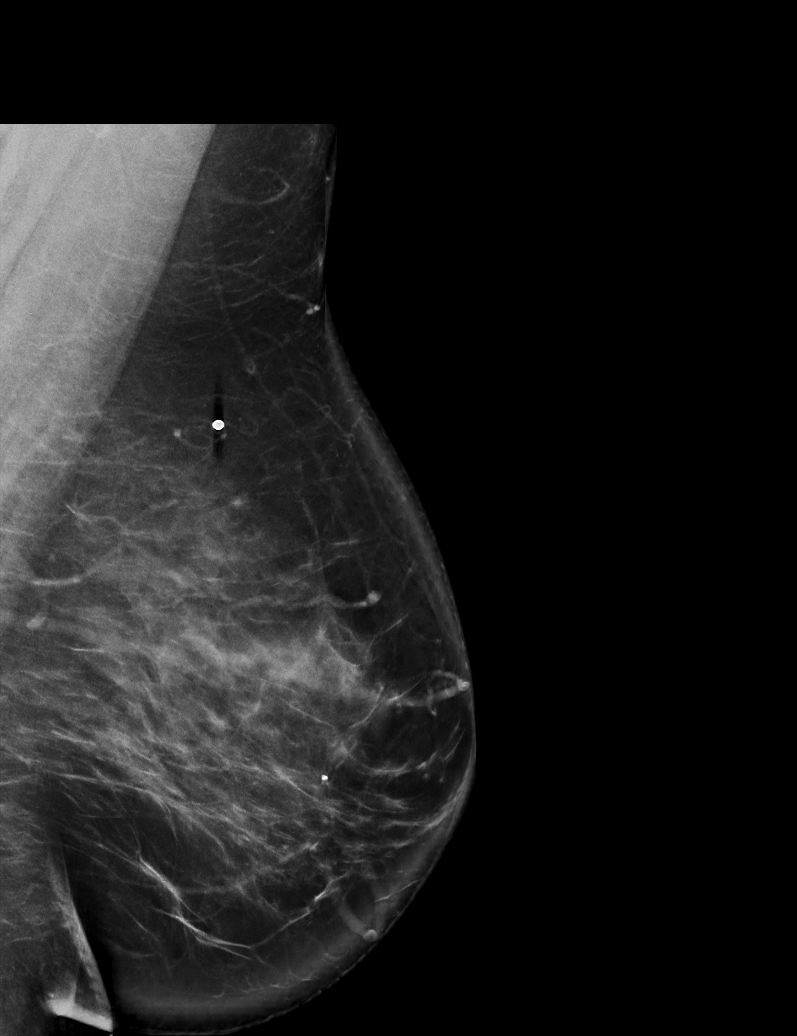

[R MLO synth-2D]
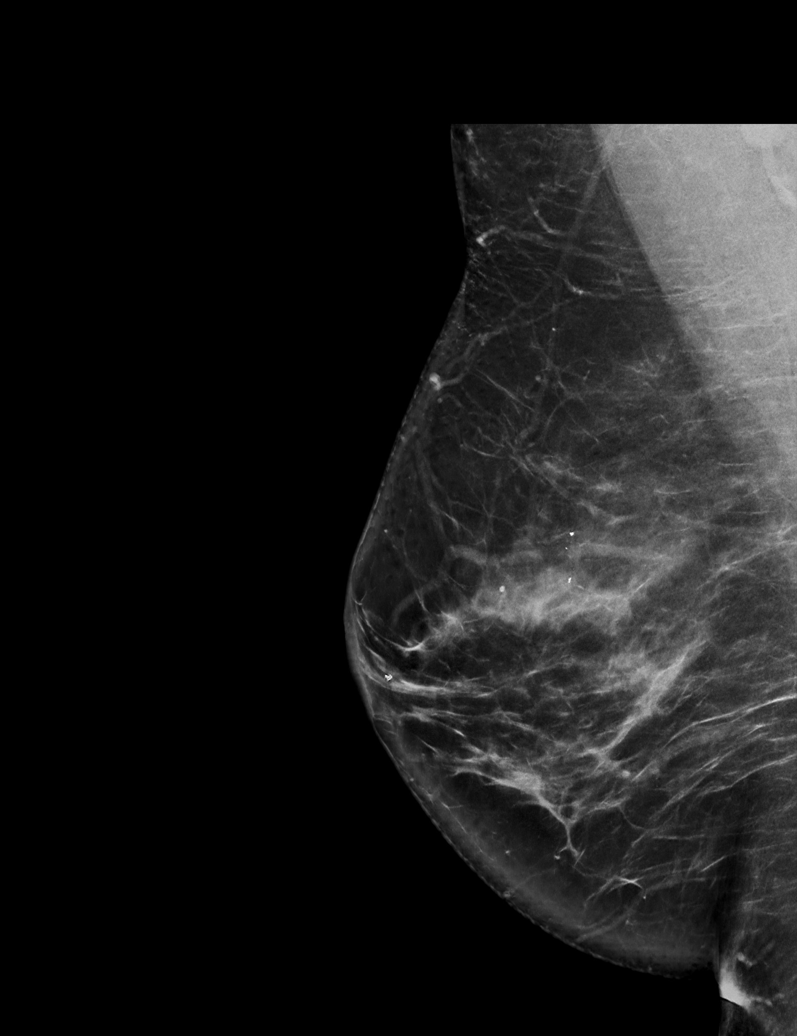

[R CC synth-2D]
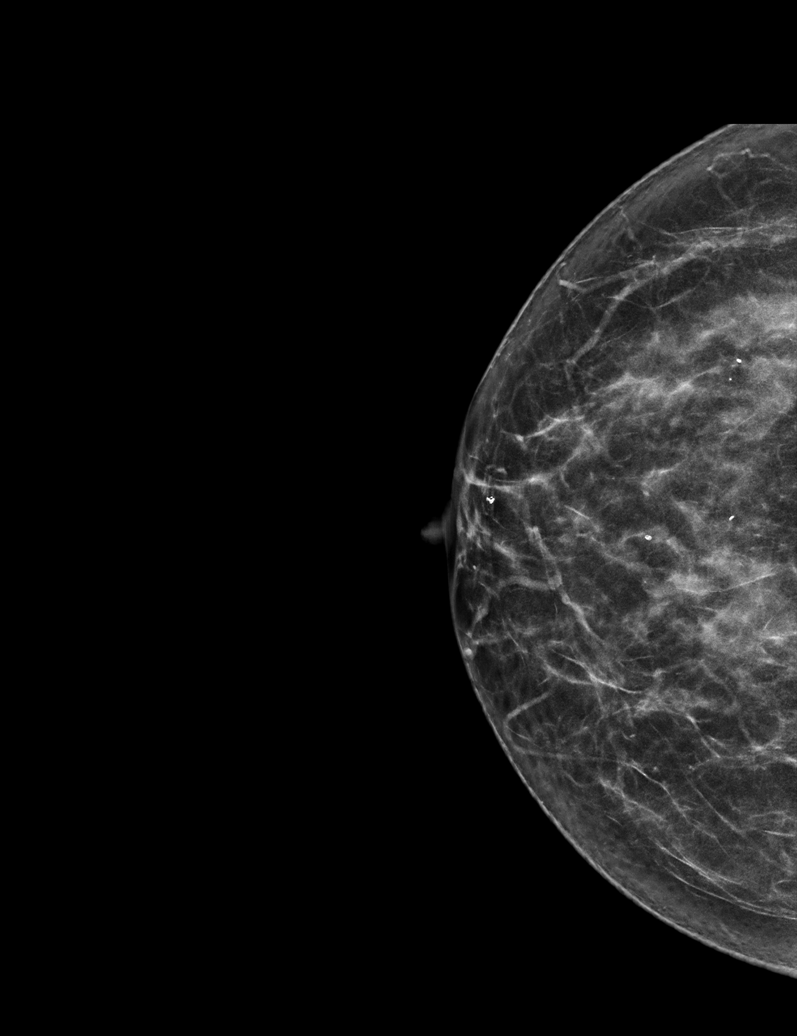

[6 of 36 positions shown; findings below may reference images not displayed]

ACR Breast Density Category c: The breast tissue is heterogeneously
dense, which may obscure small masses.
FINDINGS: No suspicious mass, distortion, or microcalcifications are
identified to suggest presence of malignancy. Spot tangential view
in the UPPER-OUTER QUADRANT of the LEFT breast shows normal
appearing fibroglandular tissue.

Targeted ultrasound is performed, showing normal appearing
fibroglandular tissue in the UPPER-OUTER QUADRANT of the LEFT
breast. No suspicious mass, distortion, or acoustic shadowing is
demonstrated with ultrasound.
IMPRESSION: 1. No mammographic or ultrasound evidence for malignancy.
2. Breast pain is a common condition. It can be exacerbated by
caffeine, hormonal changes, hormonal medications, and weight
changes. It can be improved by wearing adequate support,
over-the-counter pain medication, low-fat diet, exercise, and ice as
needed. Studies have shown an improvement in cyclic pain with use of
evening primrose oil and vitamin E. Often breast pain will resolve
on its own without intervention.

RECOMMENDATION:
Screening mammogram in one year.(Code:G5-G-VAD)

I have discussed the findings and recommendations with the patient
with the assistance of an interpreter. If applicable, a reminder
letter will be sent to the patient regarding the next appointment.

BI-RADS CATEGORY  1: Negative.

## 2023-04-12 ENCOUNTER — Telehealth: Payer: Self-pay

## 2023-04-12 NOTE — Telephone Encounter (Signed)
 Health Coaching 3  interpreter- Natale Lay, [employer]   Goals- Patient declined referral to PCP after initial screening. Patient states that she has been trying to eat healthier. Encouraged patient to continue to try and add exercise into her daily routine. Explained to patient that the goal would be for 20-30 minutes daily.      Navigation:  Patient is aware of  a follow up session. FU scheduled for 05/06/23

## 2023-05-03 NOTE — Progress Notes (Signed)
 Wisewoman follow up   Interpreter: Herma Longest, [employer]  Clinical Measurement:   Vitals:   05/06/23 1029 05/06/23 1131  BP: 122/82 122/84      Medical History: Patient states that she  does not know if she has  high cholesterol, does not have high blood pressure and she does not have diabetes. Patient states that she does not have history of gestational hypertension, does not have history of pre-eclampsia/eclampsia and she has history of gestational diabetes.     Medications: Patient states that she does not take medication to lower cholesterol, blood pressure and blood sugar.  Patient does not take an aspirin  a day to help prevent a heart attack or stroke.    Blood pressure, self measurement: Patient states that she does not measure blood pressure from home. She checks her blood pressure N/A. She shares her readings with a health care provider: N/A.   Nutrition: Patient states that on average she eats 2 cups of fruit and 2 cups of vegetables per day. Patient states that she does eat fish at least 2 times per week. Patient eats about half servings of whole grains. Patient drinks less than 36 ounces of beverages with added sugar weekly: yes. Patient is currently watching sodium or salt intake: yes. In the past 7 days patient has had 0 drinks containing alcohol. On average patient drinks 0 drinks containing alcohol per day.      Physical activity: Patient states that she gets 180 minutes of moderate and 180 minutes of vigorous physical activity each week.  Smoking status: Patient states that she has has never smoked .   Quality of life: Over the past 2 weeks patient states that she had little interest or pleasure in doing things: not at all. She has been feeling down, depressed or hopeless:not at all.   Social Determinants of Health Assessment:   Computer Use: During the last 12 months patient states that she has used any of the following: desktop/laptop, smart phone or tablet/other  portable wireless computer: yes.   Internet Use: During the last 12 months, did you or any member of your household have access to the internet: Yes, by paying a cell phone company or internet service provider.   Food Insecurities: During the last 12 months, where there any times when you were worried that you would run out of food because of a lack of money or other resources: No.   Transportation Barriers: During the last 12 months, have you missed a doctor's appointment because of transportation problems: No.   Childcare Barriers: If you are currently using childcare services, please identify  the type of services you use. (If not using childcare services, please select "Not applicable"): not applicable. During the last 12 months, have you had any barriers to childcare services such as: not applicable.   Housing: What is your housing situation today: I have housing.   Intimate Partner Violence: During the last 12 months, how often did your partner physically hurt you: never. During the last 12 months, how often did your partner insult you or talk down to you: never.  Medication Adherence: During the last 12 months, did you ever forget to take your medicine: No. During the last 12 months, were you careless ar times about taking your medicine: No. During the last 12 months, when you felt better did you sometimes stop taking your medication: No. During the last 12 months, sometimes if you felt worse when you took your medicine did you stop taking  it: No.      Navigation: This was the  follow up session for this patient, I will check up on her progress in the coming months.

## 2023-05-06 ENCOUNTER — Inpatient Hospital Stay: Attending: Obstetrics and Gynecology | Admitting: *Deleted

## 2023-05-06 VITALS — BP 122/84 | Ht 59.0 in | Wt 152.2 lb

## 2023-05-06 DIAGNOSIS — Z0000 Encounter for general adult medical examination without abnormal findings: Secondary | ICD-10-CM

## 2023-06-27 ENCOUNTER — Other Ambulatory Visit (HOSPITAL_COMMUNITY): Payer: Self-pay | Admitting: Obstetrics & Gynecology

## 2023-06-27 DIAGNOSIS — Z3401 Encounter for supervision of normal first pregnancy, first trimester: Secondary | ICD-10-CM

## 2023-06-28 ENCOUNTER — Inpatient Hospital Stay (HOSPITAL_COMMUNITY)

## 2023-06-28 ENCOUNTER — Encounter (HOSPITAL_COMMUNITY): Payer: Self-pay

## 2023-06-28 ENCOUNTER — Other Ambulatory Visit: Payer: Self-pay

## 2023-06-28 ENCOUNTER — Ambulatory Visit (HOSPITAL_COMMUNITY): Admission: RE | Admit: 2023-06-28 | Payer: Self-pay | Source: Ambulatory Visit

## 2023-06-28 ENCOUNTER — Inpatient Hospital Stay (HOSPITAL_COMMUNITY)
Admission: AD | Admit: 2023-06-28 | Discharge: 2023-06-28 | Disposition: A | Discharge status: Home / Self Care | Attending: Obstetrics & Gynecology | Admitting: Obstetrics & Gynecology

## 2023-06-28 DIAGNOSIS — Z3A01 Less than 8 weeks gestation of pregnancy: Secondary | ICD-10-CM

## 2023-06-28 DIAGNOSIS — O209 Hemorrhage in early pregnancy, unspecified: Secondary | ICD-10-CM | POA: Diagnosis present

## 2023-06-28 DIAGNOSIS — O021 Missed abortion: Secondary | ICD-10-CM | POA: Insufficient documentation

## 2023-06-28 LAB — WET PREP, GENITAL
Clue Cells Wet Prep HPF POC: NONE SEEN
Sperm: NONE SEEN
Trich, Wet Prep: NONE SEEN
WBC, Wet Prep HPF POC: <10 (ref <10)
Yeast Wet Prep HPF POC: NONE SEEN

## 2023-06-28 LAB — POCT PREGNANCY, URINE: Preg Test, Ur: POSITIVE — AB

## 2023-06-28 MED ORDER — ONDANSETRON 4 MG PO TBDP: 4.0000 mg | ORAL_TABLET | Freq: Four times a day (QID) | ORAL | 0 refills | Status: AC | PRN

## 2023-06-28 MED ORDER — MISOPROSTOL 200 MCG PO TABS: ORAL_TABLET | ORAL | 1 refills | Status: DC

## 2023-06-28 MED ORDER — IBUPROFEN 600 MG PO TABS: 600.0000 mg | ORAL_TABLET | Freq: Four times a day (QID) | ORAL | 0 refills | Status: AC | PRN

## 2023-06-28 MED ORDER — OXYCODONE HCL 5 MG PO CAPS: 5.0000 mg | ORAL_CAPSULE | ORAL | 0 refills | Status: AC | PRN

## 2023-06-28 MED ORDER — MISOPROSTOL 200 MCG PO TABS
800.0000 ug | ORAL_TABLET | Freq: Once | ORAL | Status: AC
  Administered 2023-06-28: 800 ug via VAGINAL
  Filled 2023-06-28: qty 4

## 2023-06-28 NOTE — Discharge Instructions (Addendum)
 You were given cytotec  to help pass your pregnancy. You placed 4 tablets vaginally here in the MAU. You will have other prescriptions for pain and for nausea that you will need to pick up  You should return to the MAU for Severe abdominal pain Heavy bleeding, filling 1 pad per hour Weakness, dizziness, passing out or severe nausea Or any other medical/pregnancy related concern  Also return to MAU for fever, chills, or signs of infection.   ----------------------------------   Debe regresar a la [facility] de Urgencias por: 1. Dolor abdominal intenso. 2. Sangrado abundante (llenando una toalla sanitaria por hora). 3. Debilidad, mareos, desmayos o nuseas intensas. 4. Cualquier otra simtoma mdica o relacionada con el embarazo.  Tambien-- regresar a la MAU para fiebre, escalofrios or simtomas de infection

## 2023-06-28 NOTE — MAU Provider Note (Signed)
 History     CSN: [id number]  Arrival date and time: 06/28/23 9073   Event Date/Time   First Provider Initiated Contact with Patient 06/28/23 1053      Chief Complaint  Patient presents with   Vaginal Bleeding   Abdominal Pain   Vaginal Bleeding Associated symptoms include abdominal pain. Pertinent negatives include no back pain, chills, diarrhea, dysuria, fever, flank pain, nausea, rash, sore throat or vomiting.  Abdominal Pain Pertinent negatives include no diarrhea, dysuria, fever, nausea or vomiting.  43 y.o. [id number] ~12wks by LMP here with complaints of vaginal bleeding. Seen at [facility] yesterday for new OB visit. Reports regular cycles and LMP is sure. She reports she was in normal state of health yesterday and had a pap smear. She noted cramping this AM with some bleeding when she wiped. She did not need to wear a pad or anything for the bleeding.   Last delivery was in 2015  denies LOF, VB, contractions, vaginal discharge.   OB History     Gravida  4   Para  4   Term  4   Preterm  0   AB  0   Living  4      SAB  0   IAB  0   Ectopic  0   Multiple  0   Live Births  4           Past Medical History:  Diagnosis Date   Colitis    involving the cecum and right colon/notes 07/18/2015   DM (diabetes mellitus), gestational, antepartum 2015    Past Surgical History:  Procedure Laterality Date   NO PAST SURGERIES      Family History  Problem Relation Age of Onset   Diabetes Father    Hypertension Father    Hypertension Sister    Other Neg Hx     Social History   Tobacco Use   Smoking status: Never   Smokeless tobacco: Never  Vaping Use   Vaping status: Never Used  Substance Use Topics   Alcohol use: No    Alcohol/week: 0.0 standard drinks of alcohol   Drug use: No    Allergies: No Known Allergies  Medications Prior to Admission  Medication Sig Dispense Refill Last Dose/Taking   UNKNOWN TO PATIENT BCP unsure of which type.        Review of Systems  Constitutional:  Negative for chills and fever.  HENT:  Negative for congestion and sore throat.   Eyes:  Negative for pain and visual disturbance.  Respiratory:  Negative for cough, chest tightness and shortness of breath.   Cardiovascular:  Negative for chest pain.  Gastrointestinal:  Positive for abdominal pain. Negative for diarrhea, nausea and vomiting.  Endocrine: Negative for cold intolerance and heat intolerance.  Genitourinary:  Positive for vaginal bleeding. Negative for dysuria and flank pain.  Musculoskeletal:  Negative for back pain.  Skin:  Negative for rash.  Allergic/Immunologic: Negative for food allergies.  Neurological:  Negative for dizziness and light-headedness.  Psychiatric/Behavioral:  Negative for agitation.    Physical Exam   Blood pressure 133/79, pulse (!) 51, temperature 98.3 F (36.8 C), temperature source Oral, resp. rate 18, weight 70.3 kg, SpO2 98%.  Physical Exam Vitals and nursing note reviewed.  Constitutional:      General: She is not in acute distress.    Appearance: She is well-developed.  HENT:     Head: Normocephalic and atraumatic.   Eyes:     General:  No scleral icterus.    Conjunctiva/sclera: Conjunctivae normal.    Cardiovascular:     Rate and Rhythm: Normal rate.  Pulmonary:     Effort: Pulmonary effort is normal.  Chest:     Chest wall: No tenderness.  Abdominal:     Palpations: Abdomen is soft.     Tenderness: There is no abdominal tenderness. There is no guarding or rebound.  Genitourinary:    Vagina: Normal.   Musculoskeletal:        General: Normal range of motion.     Cervical back: Normal range of motion and neck supple.   Skin:    General: Skin is warm and dry.     Findings: No rash.   Neurological:     Mental Status: She is alert and oriented to person, place, and time.    MAU Course  Procedures  [id number] Melissa [name] [birth year]  Transabdominal US  performed at  bedside Patient informed that the ultrasound is considered a limited OB ultrasound and is not intended to be a complete ultrasound exam.  Patient also informed that the ultrasound is not being completed with the intent of assessing for fetal or placental anomalies or any pelvic abnormalities.  Explained that the purpose of today's ultrasound is to assess for  viability.   Patient acknowledges the purpose of the exam and the limitations of the study. Viewed IUP with gestational sac and yolk sac. Fetal pole is present but on my measurement was 0.89 cm. I was unable to see or confirm a FHR on M Mode.  Formal US  also ordered   Suzen Maryan Masters, MD 06/28/2023, 11:31 AM   MDM- high First trimester bleeding IUP seen on BSUS, no FHR seen Formal US  ordered Reviewed images of US  s/f fetal pole >61mm with no FHR. Fetal pole measured avg 8.22mm (range 7.5-60mm). Radiology read was not available at the time of my documentation but this is consistent with failed pregnancy.   11:31 AM reviewed results with patient who is appropriately upset.  Given image of pregnancy. Offered expectant, cytotec  vs DE. Patient wanted to discussed with partner. She will call him.   12:00PM Patient prefers cytotec . Discussed here in MAU vs picking up from pharmacy. Prefers here and understands it take 6-8hrs to work. Reviewed what to expect.   Meds ordered this encounter  Medications   DISCONTD: misoprostol  (CYTOTEC ) 200 MCG tablet    Sig: Insert four tablets vaginally . Repeat if not having significant bleeding or cramping after 24hours    Dispense:  4 tablet    Refill:  1   ondansetron  (ZOFRAN -ODT) 4 MG disintegrating tablet    Sig: Take 1 tablet (4 mg total) by mouth every 6 (six) hours as needed for nausea.    Dispense:  10 tablet    Refill:  0   ibuprofen  (ADVIL ) 600 MG tablet    Sig: Take 1 tablet (600 mg total) by mouth every 6 (six) hours as needed.    Dispense:  90 tablet    Refill:  0   oxycodone  (OXY-IR) 5  MG capsule    Sig: Take 1 capsule (5 mg total) by mouth every 4 (four) hours as needed (breakthrough pain).    Dispense:  3 capsule    Refill:  0   misoprostol  (CYTOTEC ) tablet 800 mcg      Assessment and Plan   1. Vaginal bleeding affecting early pregnancy   2. Missed abortion with fetal demise before 20 completed weeks of gestation  3. [redacted] weeks gestation of pregnancy    -Discharged home, stable - Rx for pain medications and anti nausea sent - Reviewed when to return to the MAU - Recommended appt on 7/22 (already scheduled) for reproductive counseling and assure passage of pregnancy - message sent to clinic to change appt type.   Future Appointments  Date Time Provider Department Center  07/04/2023 10:00 AM [facility] None  07/04/2023 11:00 AM [facility]  07/26/2023  9:55 AM Zina Jerilynn LABOR, MD [facility]     Suzen Octave [facility] 06/28/2023, 11:31 AM

## 2023-06-28 NOTE — MAU Note (Signed)
 Melissa [name] is a 43 y.o. at Unknown here in MAU reporting: she's [redacted] weeks pregnant and began having VB with wiping today.  [facility] and [facility] Smear, unsure if that's what is the cause of VB today.  Also c/o lower abdominal pain that radiates into lower back, reports pain is a constant pressure  LMP: 04/01/2023 Onset of complaint: today Pain score: 7 Vitals:   06/28/23 1014  BP: 133/79  Pulse: (!) 51  Resp: 18  Temp: 98.3 F (36.8 C)  SpO2: 98%     FHT: attempted, none heard  States has US  today @ 1530 @ [facility].  Reports during yesterdays visit provider didn't hear FHT, but saw pregnancy on a screen  Lab orders placed from triage: UPT

## 2023-06-29 LAB — GC/CHLAMYDIA PROBE AMP (~~LOC~~) NOT AT ARMC
Chlamydia: NEGATIVE
Comment: NEGATIVE
Comment: NORMAL
Neisseria Gonorrhea: NEGATIVE

## 2023-07-04 ENCOUNTER — Other Ambulatory Visit: Payer: Self-pay

## 2023-07-04 ENCOUNTER — Other Ambulatory Visit

## 2023-07-04 ENCOUNTER — Inpatient Hospital Stay: Attending: Obstetrics and Gynecology

## 2023-07-04 VITALS — BP 151/103 | Ht 59.0 in | Wt 153.2 lb

## 2023-07-04 DIAGNOSIS — Z1231 Encounter for screening mammogram for malignant neoplasm of breast: Secondary | ICD-10-CM

## 2023-07-04 DIAGNOSIS — Z0000 Encounter for general adult medical examination without abnormal findings: Secondary | ICD-10-CM

## 2023-07-04 NOTE — Progress Notes (Signed)
 [facility] Re-screening   Interpreter- Bernice Angry, [place]   Clinical Measurement:  Vitals:   07/04/23 1014 07/04/23 1033  BP: (!) 146/89 (!) 151/103   Fasting Labs Drawn Today, will review with patient when they result.   Medical History: Patient states that she does not have high cholesterol, does not have high blood pressure and she does not have diabetes. Patient states that she does not have history of gestational hypertension, does not have history of pre-eclampsia/eclampsia and she has history of gestational diabetes.    Medications: Patient states that she does not take medication to lower cholesterol, blood pressure or blood sugar.  Patient does not take an aspirin  a day to help prevent a heart attack or stroke.    Blood pressure, self measurement: Patient states that she does not measure blood pressure from home. She checks her blood pressure N/A. She shares her readings with a health care provider: N/A.   Nutrition: Patient states that on average she eats 3 cups of fruit and 3 cups of vegetables per day. Patient states that she does eat fish at least 2 times per week. Patient eats more than half servings of whole grains. Patient drinks less than 36 ounces of beverages with added sugar weekly: yes. Patient is currently watching sodium or salt intake: yes. In the past 7 days patient has consumed drinks containing alcohol on 0 days. On a day that patient consumes drinks containing alcohol on average 0 drinks are consumed.      Physical activity: Patient states that she gets 0 minutes of moderate and 0 minutes of vigorous physical activity each week.  Smoking status: Patient states that she has has never smoked .   Quality of life: Over the past 2 weeks patient states that she had little interest or pleasure in doing things: not at all. She has been feeling down, depressed or hopeless:not at all.   Social Determinants of Health Assessment:   Computer Use: During the last 12 months  patient states that she has used any of the following: desktop/laptop, smart phone or tablet/other portable wireless computer: yes.   Internet Use: During the last 12 months, did you or any member of your household have access to the internet: Yes, by paying a cell phone company or internet service provider.   Food Insecurities: During the last 12 months, where there any times when you were worried that you would run out of food because of a lack of money or other resources: No.   Transportation Barriers: During the last 12 months, have you missed a doctor's appointment because of transportation problems: No.   Childcare Barriers: If you are currently using childcare services, please identify  the type of services you use. (If not using childcare services, please select Not applicable): not applicable. During the last 12 months, have you had any barriers to childcare services such as: not applicable.   Housing: What is your housing situation today: I have housing.   Intimate Partner Violence: During the last 12 months, how often did your partner physically hurt you: never. During the last 12 months, how often did your partner insult you or talk down to you: never.  Medication Adherence: During the last 12 months, did you ever forget to take your medicine: not applicable. During the last 12 months, were you careless ar times about taking your medicine: not applicable. During the last 12 months, when you felt better did you sometimes stop taking your medication: not applicable. During the last  12 months, sometimes if you felt worse when you took your medicine did you stop taking it: not applicable.   Risk reduction and counseling:   Health Coaching: Patient has been practicing a heart healthy diet. Encouraged her to continue watching her diet. Patient has not been exercising recently but is motivated to start. Patient enjoys walking and has set an exercise based goal.  Goal: Patient will start  walking 2-3 times per week for 20 minutes. Patient will work on reaching this goal over the next month. Once patient reaches goal she can then increase her goal to 4-5 days per week.    Navigation:  I will notify patient of lab results.  Patient is aware of 2 more health coaching sessions and a follow up. Will refer patient to Internal Medicine for FU for elevated BP.

## 2023-07-05 LAB — HEMOGLOBIN A1C
Est. average glucose Bld gHb Est-mCnc: 114 mg/dL
Hgb A1c MFr Bld: 5.6 % (ref 4.8–5.6)

## 2023-07-05 LAB — GLUCOSE, RANDOM: Glucose: 94 mg/dL (ref 70–99)

## 2023-07-05 LAB — LIPID PANEL
Chol/HDL Ratio: 2.7 ratio (ref 0.0–4.4)
Cholesterol, Total: 179 mg/dL (ref 100–199)
HDL: 66 mg/dL (ref >39)
LDL Chol Calc (NIH): 101 mg/dL — ABNORMAL HIGH (ref 0–99)
Triglycerides: 63 mg/dL (ref 0–149)
VLDL Cholesterol Cal: 12 mg/dL (ref 5–40)

## 2023-07-14 ENCOUNTER — Ambulatory Visit: Payer: Self-pay

## 2023-07-24 NOTE — Progress Notes (Unsigned)
 CC: [facility] women visit  HPI:  [name] is a 43 y.o. female living with a history stated below and presents today for elevated blood pressure. Please see problem based assessment and plan for additional details.  Past Medical History:  Diagnosis Date   Colitis    involving the cecum and right colon/notes 07/18/2015   DM (diabetes mellitus), gestational, antepartum 2015    Current Outpatient Medications on File Prior to Visit  Medication Sig Dispense Refill   ibuprofen  (ADVIL ) 600 MG tablet Take 1 tablet (600 mg total) by mouth every 6 (six) hours as needed. (Patient not taking: Reported on 07/04/2023) 90 tablet 0   ondansetron  (ZOFRAN -ODT) 4 MG disintegrating tablet Take 1 tablet (4 mg total) by mouth every 6 (six) hours as needed for nausea. (Patient not taking: Reported on 07/04/2023) 10 tablet 0   oxycodone  (OXY-IR) 5 MG capsule Take 1 capsule (5 mg total) by mouth every 4 (four) hours as needed (breakthrough pain). (Patient not taking: Reported on 07/04/2023) 3 capsule 0   UNKNOWN TO PATIENT BCP unsure of which type. (Patient not taking: Reported on 07/04/2023)     No current facility-administered medications on file prior to visit.    Family History  Problem Relation Age of Onset   Diabetes Father    Hypertension Father    Hypertension Sister    Other Neg Hx     Social History   Socioeconomic History   Marital status: Single    Spouse name: Melissa [name](boyfriend)   Number of children: 4   Years of education: 6    Highest education level: Not on file  Occupational History   Occupation: [employer]  Tobacco Use   Smoking status: Never   Smokeless tobacco: Never  Vaping Use   Vaping status: Never Used  Substance and Sexual Activity   Alcohol use: No    Alcohol/week: 0.0 standard drinks of alcohol   Drug use: No   Sexual activity: Not Currently    Partners: Male    Birth control/protection: Pill  Other Topics Concern   Not on file  Social History  Narrative   Originally from [place]   Came to [employer] [place] [employer]. In 2003   Lives at home with her 4 children   Long term boyfriend--7 years.   Social Drivers of Corporate investment banker Strain: Not on file  Food Insecurity: No Food Insecurity (05/06/2023)   Hunger Vital Sign    Worried About Running Out of Food in the Last Year: Never true    Ran Out of Food in the Last Year: Never true  Transportation Needs: No Transportation Needs (05/06/2023)   PRAPARE - Administrator, Civil Service (Medical): No    Lack of Transportation (Non-Medical): No  Physical Activity: Not on file  Stress: Not on file  Social Connections: Not on file  Intimate Partner Violence: Not on file    Review of Systems: ROS negative except for what is noted on the assessment and plan.  Vitals:   07/25/23 1516  BP: 123/77  Pulse: 83  Temp: 97.8 F (36.6 C)  TempSrc: Oral  SpO2: 100%  Weight: 158 lb 9.6 oz (71.9 kg)  Height: 4' 11 (1.499 m)    Physical Exam  Physical Exam: Constitutional: well-appearing in no acute distress HENT: normocephalic atraumatic, mucous membranes moist Eyes: conjunctiva non-erythematous Neck: supple Cardiovascular: regular rate and rhythm, no m/r/g Pulmonary/Chest: normal work of breathing on room air, lungs clear to auscultation bilaterally Abdominal: soft,  non-tender, non-distended Neurological: alert & oriented x 3, 5/5 strength in bilateral upper and lower extremities, normal gait Skin: warm and dry  Assessment & Plan:   Elevated blood pressure reading The patient presented today to the clinic following an episode of elevated blood pressure (150) approximately 2 months ago. She has not been monitoring her blood pressure at home. She also reports a previous elevated reading about 2 years ago. Today, her blood pressure is well controlled. She denies vision changes, headache, or chest pain. The patient reports that she started cutting out junk food and reducing salt intake  about 2 weeks ago. She also walks for about 1 hour daily,  Physical exam reveals normal lung and heart sounds.  Plan:  Instruct patient to get a BP cuff and check blood pressure twice daily (morning and evening) at home for 2 weeks. Follow-up appointment in 2 weeks with the RN to review home blood pressure readings She is encouraged to continue these healthy dietary changes and to increase her intake of fiber and vegatable.   Patient seen with Dr. Karna Armando Rossetti M.D [facility] Internal Medicine, PGY-1 Phone: [phone] Date 07/25/2023 Time 4:24 PM

## 2023-07-25 ENCOUNTER — Ambulatory Visit: Payer: Self-pay

## 2023-07-25 ENCOUNTER — Other Ambulatory Visit: Payer: Self-pay

## 2023-07-25 VITALS — BP 123/77 | HR 83 | Temp 97.8°F | Ht 59.0 in | Wt 158.6 lb

## 2023-07-25 DIAGNOSIS — R030 Elevated blood-pressure reading, without diagnosis of hypertension: Secondary | ICD-10-CM

## 2023-07-25 NOTE — Patient Instructions (Addendum)
 Gracias, Sra. Madeline Rilla Breed, por permitirnos brindarle atencin hoy. Hemos hablado sobre el episodio de presin arterial elevada que experiment hace aproximadamente [employer]. [place], su presin arterial est bien controlada.  Para entender mejor las tendencias de su presin arterial, necesitamos que la mida en casa dos veces al da, una vez en la maana y una vez en la tarde, y que anote las lecturas. Por favor, traiga su registro [employer].  Puede comprar un tensimetro en cualquier farmacia o en lnea. Para asegurarse de obtener un dispositivo confiable y preciso, puede consultar un sitio web que lista monitores de presin arterial validados. De esta manera, sabr que est usando un tensimetro que proporciona Agricultural consultant. El enlace es: [email or web address]  Tendr una visita de seguimiento con una enfermera registrada para revisar sus lecturas de presin arterial. Si su presin arterial permanece dentro del rango normal, no necesitar medicamento por ahora. Si sigue elevada, discutiremos las mejores opciones de [place].  Fue un placer atenderla hoy en la clnica. No dude en hacer cualquier pregunta que tenga.  He ordenado los siguientes estudios para usted:  rdenes de laboratorio No se ordenaron pruebas de laboratorio hoy.  Referencias ordenadas hoy:  rdenes de referencia No se solicitaron referencias hoy.  He ordenado los siguientes medicamentos/cambiado los siguientes medicamentos:  Suspender los siguientes medicamentos: No hay medicamentos descontinuados.  Iniciar los siguientes medicamentos: No se ordenaron International aid/development worker.  Seguimiento: 2 semanas con enfermera registrada.  Recuerde:  Si tiene alguna pregunta o inquietud, por favor llame a la clnica de medicina interna al [phone].  Armando Rossetti, M.D [facility]

## 2023-07-25 NOTE — Assessment & Plan Note (Signed)
 The patient presented today to the clinic following an episode of elevated blood pressure (150) approximately 2 months ago. She has not been monitoring her blood pressure at home. She also reports a previous elevated reading about 2 years ago. Today, her blood pressure is well controlled. She denies vision changes, headache, or chest pain. The patient reports that she started cutting out junk food and reducing salt intake about 2 weeks ago. She also walks for about 1 hour daily,  Physical exam reveals normal lung and heart sounds.  Plan:  Instruct patient to get a BP cuff and check blood pressure twice daily (morning and evening) at home for 2 weeks. Follow-up appointment in 2 weeks with the RN to review home blood pressure readings She is encouraged to continue these healthy dietary changes and to increase her intake of fiber and vegatable.

## 2023-07-26 ENCOUNTER — Ambulatory Visit: Admitting: Obstetrics and Gynecology

## 2023-07-29 NOTE — Progress Notes (Signed)
 Internal Medicine Clinic Attending  I was physically present during the key portions of the resident provided service and participated in the medical decision making of patient's management care. I reviewed pertinent patient test results.  The assessment, diagnosis, and plan were formulated together and I agree with the documentation in the resident's note.  Dickie La, MD

## 2023-08-08 ENCOUNTER — Ambulatory Visit: Payer: Self-pay | Admitting: *Deleted

## 2023-08-08 DIAGNOSIS — Z0130 Encounter for examination of blood pressure without abnormal findings: Secondary | ICD-10-CM

## 2023-08-08 NOTE — Progress Notes (Unsigned)
    Melissa [name] presented today for blood pressure check. Patient is not prescribed blood pressure medications and I confirmed that patient did not take their blood pressure medication prior to today's appointment. Blood pressure was taken in the usual and appropriate manner using an automated BP cuff.     Vitals:   08/08/23 1115  BP: 134/80      Results of today's visit will be routed to DR. Azadagen  for review and further management.

## 2023-08-09 ENCOUNTER — Ambulatory Visit (INDEPENDENT_AMBULATORY_CARE_PROVIDER_SITE_OTHER): Payer: Self-pay | Admitting: Obstetrics and Gynecology

## 2023-08-09 ENCOUNTER — Encounter: Payer: Self-pay | Admitting: Obstetrics and Gynecology

## 2023-08-09 ENCOUNTER — Other Ambulatory Visit: Payer: Self-pay

## 2023-08-09 VITALS — BP 131/87 | HR 76 | Wt 156.9 lb

## 2023-08-09 DIAGNOSIS — Z758 Other problems related to medical facilities and other health care: Secondary | ICD-10-CM

## 2023-08-09 DIAGNOSIS — Z603 Acculturation difficulty: Secondary | ICD-10-CM

## 2023-08-09 DIAGNOSIS — Z3A12 12 weeks gestation of pregnancy: Secondary | ICD-10-CM

## 2023-08-09 DIAGNOSIS — Z1331 Encounter for screening for depression: Secondary | ICD-10-CM

## 2023-08-09 DIAGNOSIS — O039 Complete or unspecified spontaneous abortion without complication: Secondary | ICD-10-CM

## 2023-08-09 NOTE — Progress Notes (Signed)
   GYNECOLOGY OFFICE NOTE  History:  43 y.o. [id number] here today for SAB follow up. Had NOB at [facility] on 06/27/23, was bleeding at that point and no FHR found, then started bleeding 06/28/23 and went to MAU. Dx with missed ab, given options, she opted for cytotec . She took cytotec  in MAU, passed a fetus/sac with some bleeding in MAU. She bled for about 10 days after. No irregular bleeding since. Then had a regular period on 07/28/23.   Feels like she was depressed after it happened, but now doing okay.  Denies nausea/vomiting, foul smelling discharge, fever/chills.  Past Medical History:  Diagnosis Date   Colitis    involving the cecum and right colon/notes 07/18/2015   DM (diabetes mellitus), gestational, antepartum 2015   Past Surgical History:  Procedure Laterality Date   NO PAST SURGERIES      Current Outpatient Medications:    ibuprofen  (ADVIL ) 600 MG tablet, Take 1 tablet (600 mg total) by mouth every 6 (six) hours as needed. (Patient not taking: Reported on 07/04/2023), Disp: 90 tablet, Rfl: 0   ondansetron  (ZOFRAN -ODT) 4 MG disintegrating tablet, Take 1 tablet (4 mg total) by mouth every 6 (six) hours as needed for nausea. (Patient not taking: Reported on 07/04/2023), Disp: 10 tablet, Rfl: 0   oxycodone  (OXY-IR) 5 MG capsule, Take 1 capsule (5 mg total) by mouth every 4 (four) hours as needed (breakthrough pain). (Patient not taking: Reported on 07/04/2023), Disp: 3 capsule, Rfl: 0   UNKNOWN TO PATIENT, BCP unsure of which type. (Patient not taking: Reported on 07/04/2023), Disp: , Rfl:   The following portions of the patient's history were reviewed and updated as appropriate: allergies, current medications, past family history, past medical history, past social history, past surgical history and problem list.   Review of Systems:  Pertinent items noted in HPI and remainder of comprehensive ROS otherwise negative.   Objective:  Physical Exam BP 131/87   Pulse 76   Wt 156 lb 14.4 oz  (71.2 kg)   BMI 31.69 kg/m  CONSTITUTIONAL: Well-developed, well-nourished female in no acute distress.  HENT:  Normocephalic, atraumatic. External right and left ear normal. Oropharynx is clear and moist EYES: Conjunctivae and EOM are normal. Pupils are equal, round, and reactive to light. No scleral icterus.  NECK: Normal range of motion, supple, no masses SKIN: Skin is warm and dry. No rash noted. Not diaphoretic. No erythema. No pallor. NEUROLOGIC: Alert and oriented to person, place, and time. Normal reflexes, muscle tone coordination. No cranial nerve deficit noted. PSYCHIATRIC: Normal mood and affect. Normal behavior. Normal judgment and thought content. CARDIOVASCULAR: Normal heart rate noted RESPIRATORY: Effort normal, no problems with respiration noted ABDOMEN: Soft, no distention noted.   PELVIC: deferred MUSCULOSKELETAL: Normal range of motion. No edema noted.   Labs and Imaging No results found.  Assessment & Plan:   1. SAB (spontaneous abortion) (Primary) - Clinically, she has completed abortion - reviewed that we could get HCG but since she has had no symptoms, I do not think this is necessary, she is in agreement - wants nexplanon , to be done at [facility] due to cost - offered [facility] for loss, she declines for now - follow up PRN  2. Language barrier Spanish interpretor used   Routine preventative health maintenance measures emphasized. Please refer to After Visit Summary for other counseling recommendations.   Return for prn.   K. Yolanda Moats, MD, [facility] Attending Center for [facility])

## 2023-09-15 ENCOUNTER — Ambulatory Visit
Admission: RE | Admit: 2023-09-15 | Discharge: 2023-09-15 | Disposition: A | Discharge status: Home / Self Care | Source: Ambulatory Visit | Attending: Obstetrics and Gynecology | Admitting: Obstetrics and Gynecology

## 2023-09-15 ENCOUNTER — Ambulatory Visit: Payer: Self-pay | Admitting: *Deleted

## 2023-09-15 VITALS — BP 142/95 | Wt 154.0 lb

## 2023-09-15 DIAGNOSIS — Z1239 Encounter for other screening for malignant neoplasm of breast: Secondary | ICD-10-CM

## 2023-09-15 DIAGNOSIS — Z1231 Encounter for screening mammogram for malignant neoplasm of breast: Secondary | ICD-10-CM

## 2023-09-15 NOTE — Progress Notes (Signed)
 [name] is a 43 y.o. female who presents to [facility] clinic today with no complaints.    Pap Smear: Pap smear not completed today. Last Pap smear was 08/26/2022 at [facility] clinic and was normal with negative HPV. Per patient has no history of an abnormal Pap smear. Last Pap smear result is available in Epic.   Physical exam: Breasts Breasts symmetrical. No skin abnormalities bilateral breasts. No nipple retraction bilateral breasts. No nipple discharge bilateral breasts. No lymphadenopathy. No lumps palpated bilateral breasts. No complaints of pain or tenderness on exam.  MS 3D SCR MAMMO BILAT BR (aka MM) Result Date: 08/30/2022 CLINICAL DATA:  Screening. EXAM: DIGITAL SCREENING BILATERAL MAMMOGRAM WITH TOMOSYNTHESIS AND CAD TECHNIQUE: Bilateral screening digital craniocaudal and mediolateral oblique mammograms were obtained. Bilateral screening digital breast tomosynthesis was performed. The images were evaluated with computer-aided detection. COMPARISON:  Previous exam(s). ACR Breast Density Category c: The breasts are heterogeneously dense, which may obscure small masses. FINDINGS: There are no findings suspicious for malignancy. IMPRESSION: No mammographic evidence of malignancy. A result letter of this screening mammogram will be mailed directly to the patient. RECOMMENDATION: Screening mammogram in one year. (Code:SM-B-01Y) BI-RADS CATEGORY  1: Negative. Electronically Signed   By: Dina  Arceo M.D.   On: 08/30/2022 15:57   MS DIGITAL SCREENING TOMO BILATERAL Result Date: 08/06/2021 CLINICAL DATA:  Screening. EXAM: DIGITAL SCREENING BILATERAL MAMMOGRAM WITH TOMOSYNTHESIS AND CAD TECHNIQUE: Bilateral screening digital craniocaudal and mediolateral oblique mammograms were obtained. Bilateral screening digital breast tomosynthesis was performed. The images were evaluated with computer-aided detection. COMPARISON:  Previous exam(s). ACR Breast Density Category c: The breast tissue is  heterogeneously dense, which may obscure small masses. FINDINGS: There are no findings suspicious for malignancy. IMPRESSION: No mammographic evidence of malignancy. A result letter of this screening mammogram will be mailed directly to the patient. RECOMMENDATION: Screening mammogram in one year. (Code:SM-B-01Y) BI-RADS CATEGORY  1: Negative. Electronically Signed   By: Rosaline Collet M.D.   On: 08/06/2021 11:24   MS DIGITAL DIAG TOMO BILAT Result Date: 07/22/2020 CLINICAL DATA:  Patient reports focal tenderness in the UPPER-OUTER QUADRANT of the LEFT breast. Pain since April. EXAM: DIGITAL DIAGNOSTIC BILATERAL MAMMOGRAM WITH TOMOSYNTHESIS AND CAD; ULTRASOUND LEFT BREAST LIMITED TECHNIQUE: Bilateral digital diagnostic mammography and breast tomosynthesis was performed. The images were evaluated with computer-aided detection.; Targeted ultrasound examination of the left breast was performed COMPARISON:  None. ACR Breast Density Category c: The breast tissue is heterogeneously dense, which may obscure small masses. FINDINGS: No suspicious mass, distortion, or microcalcifications are identified to suggest presence of malignancy. Spot tangential view in the UPPER-OUTER QUADRANT of the LEFT breast shows normal appearing fibroglandular tissue. Targeted ultrasound is performed, showing normal appearing fibroglandular tissue in the UPPER-OUTER QUADRANT of the LEFT breast. No suspicious mass, distortion, or acoustic shadowing is demonstrated with ultrasound. IMPRESSION: 1. No mammographic or ultrasound evidence for malignancy. 2. Breast pain is a common condition. It can be exacerbated by caffeine, hormonal changes, hormonal medications, and weight changes. It can be improved by wearing adequate support, over-the-counter pain medication, low-fat diet, exercise, and ice as needed. Studies have shown an improvement in cyclic pain with use of evening primrose oil and vitamin E. Often breast pain will resolve on its own  without intervention. RECOMMENDATION: Screening mammogram in one year.(Code:SM-B-01Y) I have discussed the findings and recommendations with the patient with the assistance of an interpreter. If applicable, a reminder letter will be sent to the patient regarding the next appointment. BI-RADS  CATEGORY  1: Negative. Electronically Signed   By: Almarie Daring M.D.   On: 07/22/2020 14:31    Pelvic/Bimanual Pap is not indicated today per BCCCP guidelines.   Smoking History: Patient has never smoked.   Patient Navigation: Patient education provided. Access to services provided for patient through [place] program. Spanish interpreter Bernice Angry from [facility] provided.    Breast and Cervical Cancer Risk Assessment: Patient does not have family history of breast cancer, known genetic mutations, or radiation treatment to the chest before age 71. Patient does not have history of cervical dysplasia, immunocompromised, or DES exposure in-utero.  Risk Scores as of Encounter on 09/15/2023     Melissa [name]           5-year 0.47%   Lifetime 6.52%   This patient is [place]/Latina but has no documented birth country, so the [place] model used data from [place] patients to calculate their risk score. Document a birth country in the Demographics activity for a more accurate score.         Last calculated by Melissa [name], CMA on 09/15/2023 at 11:51 AM        A: BCCCP exam without pap smear No complaints.  P: Referred patient to the [facility] for a screening mammogram on mobile unit. Appointment scheduled Thursday, September 15, 2023 at 1140.  Melissa Wanda SQUIBB, RN 09/15/2023 12:06 PM

## 2023-09-15 NOTE — Patient Instructions (Signed)
 Explained breast self awareness with Melissa [name]. Patient did not need a Pap smear today due to last Pap smear and HPV typing was 08/26/2022. Let her know BCCCP will cover Pap smears and HPV typing every 5 years unless has a history of abnormal Pap smears. Referred patient to the [facility] for a screening mammogram on mobile unit. Appointment scheduled Thursday, September 15, 2023 at 1140. Patient aware of appointment and will be there. Let patient know the Breast Center will follow up with her within the next couple weeks with results of her mammogram by letter or phone. Keandria Berrocal verbalized understanding.  Akiera Allbaugh, Wanda Ship, RN 12:06 PM
